# Patient Record
Sex: Male | Born: 1945 | Race: White | Hispanic: No | Marital: Married | Smoking: Former smoker
Health system: Southern US, Community
[De-identification: ages and names within clinical notes are randomized; demographics above are authoritative.]

## PROBLEM LIST (undated history)

## (undated) DIAGNOSIS — E785 Hyperlipidemia, unspecified: Secondary | ICD-10-CM

## (undated) DIAGNOSIS — N189 Chronic kidney disease, unspecified: Secondary | ICD-10-CM

## (undated) DIAGNOSIS — I2510 Atherosclerotic heart disease of native coronary artery without angina pectoris: Secondary | ICD-10-CM

## (undated) DIAGNOSIS — I10 Essential (primary) hypertension: Secondary | ICD-10-CM

## (undated) HISTORY — PX: CORONARY ANGIOPLASTY WITH STENT PLACEMENT: SHX49

## (undated) HISTORY — PX: HERNIA REPAIR: SHX51

---

## 2016-07-23 ENCOUNTER — Inpatient Hospital Stay (HOSPITAL_COMMUNITY)
Admission: EM | Admit: 2016-07-23 | Discharge: 2016-07-27 | DRG: 247 | Disposition: A | Discharge status: Home / Self Care | Payer: Medicare PPO | Attending: Cardiology | Admitting: Cardiology

## 2016-07-23 ENCOUNTER — Emergency Department (HOSPITAL_COMMUNITY): Payer: Medicare PPO

## 2016-07-23 ENCOUNTER — Encounter (HOSPITAL_COMMUNITY): Payer: Self-pay | Admitting: *Deleted

## 2016-07-23 DIAGNOSIS — N183 Chronic kidney disease, stage 3 unspecified: Secondary | ICD-10-CM | POA: Diagnosis present

## 2016-07-23 DIAGNOSIS — N189 Chronic kidney disease, unspecified: Secondary | ICD-10-CM | POA: Diagnosis present

## 2016-07-23 DIAGNOSIS — I25700 Atherosclerosis of coronary artery bypass graft(s), unspecified, with unstable angina pectoris: Secondary | ICD-10-CM | POA: Diagnosis not present

## 2016-07-23 DIAGNOSIS — E876 Hypokalemia: Secondary | ICD-10-CM | POA: Diagnosis present

## 2016-07-23 DIAGNOSIS — Z87891 Personal history of nicotine dependence: Secondary | ICD-10-CM

## 2016-07-23 DIAGNOSIS — I2510 Atherosclerotic heart disease of native coronary artery without angina pectoris: Secondary | ICD-10-CM | POA: Diagnosis present

## 2016-07-23 DIAGNOSIS — R079 Chest pain, unspecified: Secondary | ICD-10-CM | POA: Diagnosis present

## 2016-07-23 DIAGNOSIS — I200 Unstable angina: Secondary | ICD-10-CM | POA: Diagnosis not present

## 2016-07-23 DIAGNOSIS — I25110 Atherosclerotic heart disease of native coronary artery with unstable angina pectoris: Principal | ICD-10-CM | POA: Diagnosis present

## 2016-07-23 DIAGNOSIS — N179 Acute kidney failure, unspecified: Secondary | ICD-10-CM | POA: Diagnosis present

## 2016-07-23 DIAGNOSIS — I129 Hypertensive chronic kidney disease with stage 1 through stage 4 chronic kidney disease, or unspecified chronic kidney disease: Secondary | ICD-10-CM | POA: Diagnosis present

## 2016-07-23 DIAGNOSIS — Z7982 Long term (current) use of aspirin: Secondary | ICD-10-CM

## 2016-07-23 DIAGNOSIS — E785 Hyperlipidemia, unspecified: Secondary | ICD-10-CM

## 2016-07-23 DIAGNOSIS — Z7902 Long term (current) use of antithrombotics/antiplatelets: Secondary | ICD-10-CM

## 2016-07-23 DIAGNOSIS — Z79899 Other long term (current) drug therapy: Secondary | ICD-10-CM | POA: Diagnosis not present

## 2016-07-23 DIAGNOSIS — Z955 Presence of coronary angioplasty implant and graft: Secondary | ICD-10-CM | POA: Diagnosis not present

## 2016-07-23 DIAGNOSIS — I10 Essential (primary) hypertension: Secondary | ICD-10-CM | POA: Diagnosis not present

## 2016-07-23 DIAGNOSIS — R0602 Shortness of breath: Secondary | ICD-10-CM | POA: Diagnosis present

## 2016-07-23 HISTORY — DX: Essential (primary) hypertension: I10

## 2016-07-23 HISTORY — DX: Atherosclerotic heart disease of native coronary artery without angina pectoris: I25.10

## 2016-07-23 HISTORY — DX: Hyperlipidemia, unspecified: E78.5

## 2016-07-23 HISTORY — DX: Chronic kidney disease, unspecified: N18.9

## 2016-07-23 LAB — TROPONIN I
TROPONIN I: 0.04 ng/mL — AB (ref <0.03)
Troponin I: 0.04 ng/mL (ref <0.03)

## 2016-07-23 LAB — I-STAT TROPONIN, ED: TROPONIN I, POC: 0.05 ng/mL (ref 0.00–0.08)

## 2016-07-23 LAB — BASIC METABOLIC PANEL
Anion gap: 7 (ref 5–15)
BUN: 20 mg/dL (ref 6–20)
CO2: 25 mmol/L (ref 22–32)
Calcium: 9.2 mg/dL (ref 8.9–10.3)
Chloride: 106 mmol/L (ref 101–111)
Creatinine, Ser: 1.65 mg/dL — ABNORMAL HIGH (ref 0.61–1.24)
GFR, EST AFRICAN AMERICAN: 47 mL/min — AB (ref >60)
GFR, EST NON AFRICAN AMERICAN: 41 mL/min — AB (ref >60)
Glucose, Bld: 102 mg/dL — ABNORMAL HIGH (ref 65–99)
POTASSIUM: 3.7 mmol/L (ref 3.5–5.1)
SODIUM: 138 mmol/L (ref 135–145)

## 2016-07-23 LAB — CBC
HEMATOCRIT: 44.7 % (ref 39.0–52.0)
Hemoglobin: 14.9 g/dL (ref 13.0–17.0)
MCH: 31.1 pg (ref 26.0–34.0)
MCHC: 33.3 g/dL (ref 30.0–36.0)
MCV: 93.3 fL (ref 78.0–100.0)
PLATELETS: 218 10*3/uL (ref 150–400)
RBC: 4.79 MIL/uL (ref 4.22–5.81)
RDW: 12.8 % (ref 11.5–15.5)
WBC: 7.4 10*3/uL (ref 4.0–10.5)

## 2016-07-23 LAB — HEPATIC FUNCTION PANEL
ALBUMIN: 3.8 g/dL (ref 3.5–5.0)
ALK PHOS: 39 U/L (ref 38–126)
ALT: 25 U/L (ref 17–63)
AST: 26 U/L (ref 15–41)
Bilirubin, Direct: 0.1 mg/dL (ref 0.1–0.5)
Indirect Bilirubin: 0.7 mg/dL (ref 0.3–0.9)
TOTAL PROTEIN: 6.5 g/dL (ref 6.5–8.1)
Total Bilirubin: 0.8 mg/dL (ref 0.3–1.2)

## 2016-07-23 LAB — TSH: TSH: 3.911 u[IU]/mL (ref 0.350–4.500)

## 2016-07-23 LAB — PROTIME-INR
INR: 1.06
Prothrombin Time: 13.8 seconds (ref 11.4–15.2)

## 2016-07-23 LAB — BRAIN NATRIURETIC PEPTIDE: B NATRIURETIC PEPTIDE 5: 22.1 pg/mL (ref 0.0–100.0)

## 2016-07-23 MED ORDER — ASPIRIN 81 MG PO CHEW
324.0000 mg | CHEWABLE_TABLET | Freq: Once | ORAL | Status: AC
  Administered 2016-07-23: 324 mg via ORAL
  Filled 2016-07-23: qty 4

## 2016-07-23 MED ORDER — HEPARIN BOLUS VIA INFUSION
4000.0000 [IU] | Freq: Once | INTRAVENOUS | Status: AC
  Administered 2016-07-23: 4000 [IU] via INTRAVENOUS
  Filled 2016-07-23: qty 4000

## 2016-07-23 MED ORDER — OMEGA-3-ACID ETHYL ESTERS 1 G PO CAPS
1.0000 g | ORAL_CAPSULE | Freq: Every day | ORAL | Status: DC
  Administered 2016-07-24 – 2016-07-27 (×3): 1 g via ORAL
  Filled 2016-07-23 (×3): qty 1

## 2016-07-23 MED ORDER — SODIUM CHLORIDE 0.9 % IV SOLN: 250.0000 mL | INTRAVENOUS | Status: DC | PRN

## 2016-07-23 MED ORDER — AMLODIPINE BESYLATE 5 MG PO TABS
7.5000 mg | ORAL_TABLET | Freq: Every day | ORAL | Status: DC
  Administered 2016-07-24 – 2016-07-25 (×2): 7.5 mg via ORAL
  Administered 2016-07-26: 2.5 mg via ORAL
  Administered 2016-07-27: 7.5 mg via ORAL
  Filled 2016-07-23: qty 2
  Filled 2016-07-23 (×3): qty 1

## 2016-07-23 MED ORDER — SODIUM CHLORIDE 0.9% FLUSH: 3.0000 mL | Freq: Two times a day (BID) | INTRAVENOUS | Status: DC

## 2016-07-23 MED ORDER — NITROGLYCERIN IN D5W 200-5 MCG/ML-% IV SOLN
2.0000 ug/min | INTRAVENOUS | Status: DC
  Administered 2016-07-23: 5 ug/min via INTRAVENOUS
  Filled 2016-07-23: qty 250

## 2016-07-23 MED ORDER — ONDANSETRON HCL 4 MG/2ML IJ SOLN: 4.0000 mg | Freq: Four times a day (QID) | INTRAMUSCULAR | Status: DC | PRN

## 2016-07-23 MED ORDER — HEPARIN (PORCINE) IN NACL 100-0.45 UNIT/ML-% IJ SOLN
900.0000 [IU]/h | INTRAMUSCULAR | Status: DC
  Administered 2016-07-23 – 2016-07-24 (×2): 900 [IU]/h via INTRAVENOUS
  Filled 2016-07-23 (×2): qty 250

## 2016-07-23 MED ORDER — EZETIMIBE 10 MG PO TABS
5.0000 mg | ORAL_TABLET | Freq: Every day | ORAL | Status: DC
  Administered 2016-07-24 – 2016-07-27 (×4): 5 mg via ORAL
  Filled 2016-07-23 (×4): qty 1

## 2016-07-23 MED ORDER — METOPROLOL TARTRATE 12.5 MG HALF TABLET
12.5000 mg | ORAL_TABLET | Freq: Two times a day (BID) | ORAL | Status: DC
  Administered 2016-07-23 – 2016-07-27 (×6): 12.5 mg via ORAL
  Filled 2016-07-23 (×6): qty 1

## 2016-07-23 MED ORDER — AMLODIPINE BESYLATE 5 MG PO TABS
5.0000 mg | ORAL_TABLET | Freq: Once | ORAL | Status: AC
  Administered 2016-07-23: 5 mg via ORAL
  Filled 2016-07-23: qty 1

## 2016-07-23 MED ORDER — ACETAMINOPHEN 325 MG PO TABS: 650.0000 mg | ORAL_TABLET | ORAL | Status: DC | PRN

## 2016-07-23 MED ORDER — NITROGLYCERIN 2 % TD OINT: 0.5000 [in_us] | TOPICAL_OINTMENT | Freq: Once | TRANSDERMAL | Status: DC

## 2016-07-23 MED ORDER — ASPIRIN 81 MG PO CHEW
81.0000 mg | CHEWABLE_TABLET | Freq: Every day | ORAL | Status: DC
  Administered 2016-07-24 – 2016-07-26 (×3): 81 mg via ORAL
  Filled 2016-07-23 (×3): qty 1

## 2016-07-23 MED ORDER — PANTOPRAZOLE SODIUM 40 MG PO TBEC
40.0000 mg | DELAYED_RELEASE_TABLET | ORAL | Status: DC
  Administered 2016-07-26: 40 mg via ORAL
  Filled 2016-07-23: qty 1

## 2016-07-23 MED ORDER — NITROGLYCERIN 0.4 MG SL SUBL: 0.4000 mg | SUBLINGUAL_TABLET | SUBLINGUAL | Status: DC | PRN

## 2016-07-23 MED ORDER — CLOPIDOGREL BISULFATE 75 MG PO TABS
75.0000 mg | ORAL_TABLET | Freq: Every day | ORAL | Status: DC
  Administered 2016-07-24 – 2016-07-26 (×3): 75 mg via ORAL
  Filled 2016-07-23 (×3): qty 1

## 2016-07-23 MED ORDER — SODIUM CHLORIDE 0.9% FLUSH: 3.0000 mL | INTRAVENOUS | Status: DC | PRN

## 2016-07-23 NOTE — H&P (Signed)
Cardiology History & Physical    Patient ID: ALYSSA MANCERA MRN: [id number], DOB: 04/26/1946 Date of Encounter: 07/23/2016, 7:36 PM Primary Physician: No PCP Per Patient Primary Cardiologist: Dr. Buford Dresser in [place], [place]  Chief Complaint: jaw pain, SOB Reason for Admission: above Requesting MD: Dr. Cristi Loron  HPI: Mr. Ozburn is a 70 y/o M with history of CAD s/p 2 ION stents to prox RCA in 11/2012, HTN, HLD, former tobacco abuse who presented to [facility] with SOB/jaw discomfort. He is from [place] but has been living in [place] for several months. Stent card indicates moderate nonobstructive LAD and septal perforator disease. Reports repeat cath 2015 without PCI at that time. Nuc 12/2015 was unremarkable per his report. No hx of CHF known to patient.  For the past 3 weeks he just hasn't felt well. Has noticed intermittent chest discomfort that would randomly creep up into his throat. While playing sports outside this past week he would notice exertional dyspnea as well as a sense of jaw tightness, somewhat reminiscent of prior angina. Today he developed sx at rest. Over the past week he's now realized he gets significantly SOB even with minimal activity. Due to persistent sx, presented to ER. Not tachycardic, tachypneic or hypoxic. He is CP free right now. VSS except BP moderately elevated (166/100). Reports compliance with meds, very nice family. CXR with mild left base subsegmental atelectasis and or infiltrate. No cough, fever noted. Rec'd 324mg  ASA by ED and started on heparin per pharmacy for troponin 0.04 and concern for [place]. Labs otherwise notable for Cr 1.65 of uncertain chronicity.  Past Medical History:  Diagnosis Date  . Coronary artery disease    a. s/p DESx2 to RCA in 11/2012 [facility] hospital in [place].  Marland Kitchen Hyperlipidemia   . Hypertension      Surgical History:  Past Surgical History:  Procedure Laterality Date  . HERNIA REPAIR     Multiple hernia repairs     Home  Meds: Prior to Admission medications   Medication Sig Start Date End Date Taking? Authorizing Provider  amLODipine (NORVASC) 2.5 MG tablet Take 2.5 mg by mouth 2 (two) times daily.   Yes Historical Provider, MD  aspirin 81 MG chewable tablet Chew 81 mg by mouth daily.   Yes Historical Provider, MD  clopidogrel (PLAVIX) 75 MG tablet Take 75 mg by mouth daily.   Yes Historical Provider, MD  co-enzyme Q-10 30 MG capsule Take 30 mg by mouth daily.   Yes Historical Provider, MD  ezetimibe (ZETIA) 10 MG tablet Take 5 mg by mouth daily.   Yes Historical Provider, MD  Flaxseed, Linseed, (FLAXSEED OIL PO) Take 1 tablet by mouth daily.   Yes Historical Provider, MD  metoprolol tartrate (LOPRESSOR) 25 MG tablet Take 12.5 mg by mouth 2 (two) times daily.   Yes Historical Provider, MD  omega-3 acid ethyl esters (LOVAZA) 1 g capsule Take 1 g by mouth daily.   Yes Historical Provider, MD  pantoprazole (PROTONIX) 40 MG tablet Take 40 mg by mouth once a week. Take one tablet by mouth three times a week on Mon wed and fridays   Yes Historical Provider, MD    Allergies: No Known Allergies  Social History   Social History  . Marital status: Married    Spouse name: N/A  . Number of children: N/A  . Years of education: N/A   Occupational History  . Not on file.   Social History Main Topics  . Smoking  status: Former Games developer  . Smokeless tobacco: Never Used     Comment: Quit 1980s  . Alcohol use Yes  . Drug use: No  . Sexual activity: Not on file   Other Topics Concern  . Not on file   Social History Narrative  . No narrative on file     Family History  Problem Relation Age of Onset  . Other Mother     Died of "old age"  . Other Father     Died of "old age"  . CAD Neg Hx     Review of Systems: no hx of bleeding issues. All other systems reviewed and are otherwise negative except as noted above.  Labs:   Lab Results  Component Value Date   WBC 7.4 07/23/2016   HGB 14.9 07/23/2016    HCT 44.7 07/23/2016   MCV 93.3 07/23/2016   PLT 218 07/23/2016    Recent Labs Lab 07/23/16 1546  NA 138  K 3.7  CL 106  CO2 25  BUN 20  CREATININE 1.65*  CALCIUM 9.2  GLUCOSE 102*    Recent Labs  07/23/16 1546  TROPONINI 0.04*    Radiology/Studies:  Dg Chest 2 View  Result Date: 07/23/2016 CLINICAL DATA:  Pain. EXAM: CHEST  2 VIEW COMPARISON:  No recent prior . FINDINGS: Mediastinum hilar structures normal. Mild left base subsegmental atelectasis and or infiltrate. No pleural effusion or pneumothorax. Heart size normal. No acute bony abnormality . IMPRESSION: Mild left base subsegmental atelectasis and or infiltrate. Electronically Signed   By: Maisie Fus  Register   On: 07/23/2016 16:14   Wt Readings from Last 3 Encounters:  07/23/16 166 lb 9 oz (75.6 kg)    EKG: NSR 60bpm nonspecific ST changes no prior to compare to  Physical Exam: Blood pressure 144/89, pulse (!) 53, temperature 98 F (36.7 C), resp. rate 14, height 5\' 8"  (1.727 m), weight 166 lb 9 oz (75.6 kg), SpO2 97 %. Body mass index is 25.33 kg/m. General: Well developed, well nourished WM, in no acute distress. Head: Normocephalic, atraumatic, sclera non-icteric, no xanthomas, nares are without discharge.  Neck: Negative for carotid bruits. JVD not elevated. Lungs: Clear bilaterally to auscultation without wheezes, rales, or rhonchi. Breathing is unlabored. Heart: RRR with S1 S2. No murmurs, rubs, or gallops appreciated. Abdomen: Soft, non-tender, non-distended with normoactive bowel sounds. No hepatomegaly. No rebound/guarding. No obvious abdominal masses. Msk:  Strength and tone appear normal for age. Extremities: No clubbing or cyanosis. No edema.  Distal pedal pulses are 2+ and equal bilaterally. Neuro: Alert and oriented X 3. No focal deficit. No facial asymmetry. Moves all extremities spontaneously. Psych:  Responds to questions appropriately with a normal affect.    Assessment and Plan  70 y/o M with  history of CAD s/p 2 ION stents to prox RCA in 11/2012 with residual nonobstructive disease, HTN, HLD, former tobacco abuse who presented to [facility] with SOB/jaw discomfort. Troponin 0.04, Cr 1.65.  1. Jaw pain/dyspnea concerning for [place] - admit, cycle troponins, continue ASA, IV heparin, BB. He reports intolerance of multiple prior statins. Check lipids in AM. Anticipate cath on Monday if he remains stable, sooner if recurrence of symptoms refractory to medical therapy or worsening EKG changes.   2. CAD - as above.  3. HTN - BP elevated in ER. He states "it's never this high." On unusual amlodipine regimen of 2.5mg  BID - per d/w MD, given amlodipine 5mg  now and increase AM dose to 7.5mg  BID. Continue low  dose metoprolol - HR precludes increase. Change NTG paste to IV NTG which can be titrated for BP.  4. Renal insufficiency - unclear duration. Repeat BMET in AM to recheck/trend. Will need to take this into consideration when planning cath. Check echo to assess LVEF to avoid need for LV gram during cath.  5. Hyperlipidemia - check lipids in AM. May benefit from pharmD referral to discuss [facility] drugs since he has intolerance to multiple statins (if he remains local).  Pt was seen/examined by myself and Dr. Mayford Knifeurner in ER, plan formulated above per our discussion.  Signed, Laurann Montanaayna N Dunn PA-C 07/23/2016, 7:36 PM Pager: [phone]

## 2016-07-23 NOTE — Progress Notes (Signed)
ANTICOAGULATION CONSULT NOTE - Initial Consult  Pharmacy Consult for Heparin Indication: chest pain/ACS  No Known Allergies  Patient Measurements: Height: 5\' 8"  (172.7 cm) Weight: 166 lb 9 oz (75.6 kg) IBW/kg (Calculated) : 68.4 Heparin Dosing Weight: 75.6 kg  Vital Signs: Temp: 98 F (36.7 C) (09/08 1545) BP: 149/92 (09/08 1657) Pulse Rate: 51 (09/08 1657)  Labs:  Recent Labs  07/23/16 1546  HGB 14.9  HCT 44.7  PLT 218  CREATININE 1.65*  TROPONINI 0.04*    Estimated Creatinine Clearance: 40.9 mL/min (by C-G formula based on SCr of 1.65 mg/dL).   Medical History: Past Medical History:  Diagnosis Date  . Coronary artery disease   . Hypertension     Medications:   (Not in a hospital admission) Scheduled:  . aspirin  324 mg Oral Once  . nitroGLYCERIN  0.5 inch Topical Once   Infusions:    Assessment: 70yo with history of HTN and CAD presents with SOB. Pharmacy is consulted to dose heparin for ACS/chest pain.   Goal of Therapy:  Heparin level 0.3-0.7 units/ml Monitor platelets by anticoagulation protocol: Yes   Plan:  Give 4000 units bolus x 1 Start heparin infusion at 900 units/hr Check anti-Xa level in 6 hours and daily while on heparin Continue to monitor H&H and platelets  Arlean Hoppingorey M. Newman PiesBall, PharmD, BCPS Clinical Pharmacist Pager [phone] 07/23/2016,5:48 PM

## 2016-07-23 NOTE — ED Notes (Signed)
Dr. Corlis LeakMackuen notified of troponin. Pt moved back next.

## 2016-07-23 NOTE — ED Provider Notes (Signed)
[facility]-EMERGENCY DEPT Provider Note   CSN: [id number] Arrival date & time: 07/23/16  1539   History   Chief Complaint Chief Complaint  Patient presents with  . Shortness of Breath    HPI Todd Mills is a 70 y.o. male.  The history is provided by the patient and the spouse.   70 year old male with history of CAD status post 2 stents, hypertension, hyperlipidemia presenting with shortness of breath and jaw discomfort. Onset was about 3 weeks ago. Waxing and waning since then. Context- typically symptoms occur at rest. Today, patient had an episode that started at 3 PM that was more severe than prior episodes. Started at rest today. Described as feeling short of breath, with facial flushing, and jaw pressure. Similar prior symptoms when he was having a stress test and was found to have CAD leading to his cath in the past. Denies overt chest pain. Symptoms are typically alleviated by nitroglycerin. Today, he took a nitroglycerin and symptoms did not completely resolve as they had before. Associated with feeling fatigued. Mild severity now. Pt also endorses increased SOB with exertion recently and decreased exercise tolerance.    Past Medical History:  Diagnosis Date  . Coronary artery disease    a. s/p 2 ION stents to RCA in 11/2012 [facility] hospital in [facility].  Marland Kitchen Hyperlipidemia   . Hypertension     Patient Active Problem List   Diagnosis Date Noted  . Unstable angina (HCC) 07/23/2016    Past Surgical History:  Procedure Laterality Date  . HERNIA REPAIR     Multiple hernia repairs       Home Medications    Prior to Admission medications   Medication Sig Start Date End Date Taking? Authorizing Provider  amLODipine (NORVASC) 2.5 MG tablet Take 2.5 mg by mouth 2 (two) times daily.   Yes Historical Provider, MD  aspirin 81 MG chewable tablet Chew 81 mg by mouth daily.   Yes Historical Provider, MD  clopidogrel (PLAVIX) 75 MG tablet Take 75 mg by mouth daily.   Yes Historical  Provider, MD  co-enzyme Q-10 30 MG capsule Take 30 mg by mouth daily.   Yes Historical Provider, MD  ezetimibe (ZETIA) 10 MG tablet Take 5 mg by mouth daily.   Yes Historical Provider, MD  Flaxseed, Linseed, (FLAXSEED OIL PO) Take 1 tablet by mouth daily.   Yes Historical Provider, MD  metoprolol tartrate (LOPRESSOR) 25 MG tablet Take 12.5 mg by mouth 2 (two) times daily.   Yes Historical Provider, MD  omega-3 acid ethyl esters (LOVAZA) 1 g capsule Take 1 g by mouth daily.   Yes Historical Provider, MD  pantoprazole (PROTONIX) 40 MG tablet Take 40 mg by mouth once a week. Take one tablet by mouth three times a week on Mon wed and fridays   Yes Historical Provider, MD    Family History Family History  Problem Relation Age of Onset  . Other Mother     Died of "old age"  . Other Father     Died of "old age"  . CAD Neg Hx     Social History Social History  Substance Use Topics  . Smoking status: Former Games developer  . Smokeless tobacco: Never Used     Comment: Quit 1980s  . Alcohol use Yes     Allergies   Review of patient's allergies indicates no known allergies.   Review of Systems Review of Systems  Constitutional: Negative for fever.  Respiratory: Positive for shortness  of breath. Negative for cough.   Cardiovascular: Negative for chest pain and palpitations.  Gastrointestinal: Positive for nausea. Negative for abdominal pain and vomiting.  Genitourinary: Negative for decreased urine volume.  Skin: Negative for rash.  Neurological: Negative for syncope and light-headedness.  Hematological: Bruises/bleeds easily (on ASA, plavix).  All other systems reviewed and are negative.    Physical Exam Updated Vital Signs BP 129/79   Pulse 60   Temp 98 F (36.7 C)   Resp 16   Ht 5\' 8"  (1.727 m)   Wt 75.6 kg   SpO2 95%   BMI 25.33 kg/m   Physical Exam  Constitutional: He appears well-developed and well-nourished.  HENT:  Head: Normocephalic and atraumatic.  Eyes:  Conjunctivae are normal.  Neck: Neck supple.  Cardiovascular: Normal rate and regular rhythm.   No murmur heard. Pulmonary/Chest: Effort normal and breath sounds normal. No respiratory distress. He exhibits no tenderness.  Abdominal: Soft. There is no tenderness.  Musculoskeletal: He exhibits no edema.  Neurological: He is alert.  Skin: Skin is warm and dry.  Psychiatric: He has a normal mood and affect.  Nursing note and vitals reviewed.    ED Treatments / Results  Labs (all labs ordered are listed, but only abnormal results are displayed) Labs Reviewed  BASIC METABOLIC PANEL - Abnormal; Notable for the following:       Result Value   Glucose, Bld 102 (*)    Creatinine, Ser 1.65 (*)    GFR calc non Af Amer 41 (*)    GFR calc Af Amer 47 (*)    All other components within normal limits  TROPONIN I - Abnormal; Notable for the following:    Troponin I 0.04 (*)    All other components within normal limits  MRSA PCR SCREENING  CBC  PROTIME-INR  TSH  HEPATIC FUNCTION PANEL  TROPONIN I  BRAIN NATRIURETIC PEPTIDE  BASIC METABOLIC PANEL  LIPID PANEL  CBC  HEPARIN LEVEL (UNFRACTIONATED)  TROPONIN I  TROPONIN I  HEMOGLOBIN A1C  I-STAT TROPOININ, ED    EKG  EKG Interpretation  Date/Time:  Friday July 23 2016 15:43:00 EDT Ventricular Rate:  60 PR Interval:  178 QRS Duration: 86 QT Interval:  428 QTC Calculation: 428 R Axis:   74 Text Interpretation:  Normal sinus rhythm Nonspecific ST abnormality Abnormal ECG No previous tracing Confirmed by KNOTT MD, DANIEL [phone]) on 07/23/2016 5:46:24 PM       Radiology Dg Chest 2 View  Result Date: 07/23/2016 CLINICAL DATA:  Pain. EXAM: CHEST  2 VIEW COMPARISON:  No recent prior . FINDINGS: Mediastinum hilar structures normal. Mild left base subsegmental atelectasis and or infiltrate. No pleural effusion or pneumothorax. Heart size normal. No acute bony abnormality . IMPRESSION: Mild left base subsegmental atelectasis and or  infiltrate. Electronically Signed   By: Maisie Fus  Register   On: 07/23/2016 16:14    Procedures Procedures (including critical care time)  Medications Ordered in ED Medications  heparin ADULT infusion 100 units/mL (25000 units/233mL sodium chloride 0.45%) (900 Units/hr Intravenous Transfusing/Transfer 07/23/16 2254)  nitroGLYCERIN 50 mg in dextrose 5 % 250 mL (0.2 mg/mL) infusion (2 mcg/min Intravenous Transfusing/Transfer 07/23/16 2254)  amLODipine (NORVASC) tablet 7.5 mg (7.5 mg Oral Not Given 07/23/16 2231)  aspirin chewable tablet 81 mg (not administered)  clopidogrel (PLAVIX) tablet 75 mg (not administered)  ezetimibe (ZETIA) tablet 5 mg (not administered)  metoprolol tartrate (LOPRESSOR) tablet 12.5 mg (12.5 mg Oral Given 07/23/16 2231)  omega-3 acid ethyl esters (  LOVAZA) capsule 1 g (not administered)  pantoprazole (PROTONIX) EC tablet 40 mg (not administered)  nitroGLYCERIN (NITROSTAT) SL tablet 0.4 mg (not administered)  acetaminophen (TYLENOL) tablet 650 mg (not administered)  ondansetron (ZOFRAN) injection 4 mg (not administered)  sodium chloride flush (NS) 0.9 % injection 3 mL (not administered)  sodium chloride flush (NS) 0.9 % injection 3 mL (not administered)  0.9 %  sodium chloride infusion (not administered)  aspirin chewable tablet 324 mg (324 mg Oral Given 07/23/16 1839)  heparin bolus via infusion 4,000 Units (4,000 Units Intravenous Bolus from Bag 07/23/16 1843)  amLODipine (NORVASC) tablet 5 mg (5 mg Oral Given 07/23/16 2017)     Initial Impression / Assessment and Plan / ED Course  I have reviewed the triage vital signs and the nursing notes.  Pertinent labs & imaging results that were available during my care of the patient were reviewed by me and considered in my medical decision making (see chart for details).  Clinical Course    70 year old male with history of CAD status post 2 stents, hypertension, hyperlipidemia presenting with shortness of breath and jaw  discomfort, similar to prior reported anginal symptoms in the past. Were not alleviated by nitroglycerin as per usual. Occurred at rest. Patient is afebrile with stable vital signs other than bradycardia in 50s. He is on metoprolol.  EKG with nonspecific ST abnormalities, but no overt ST elevations or T-wave inversions. Troponin positive. Presentation concerning for unstable angina, possible NSTEMI. ASA given, heparin started. Admitted to Cardiology for further care, plan for likely cath upcoming.   Case discussed with Dr. Clydene PughKnott, who oversaw management of this patient.   Final Clinical Impressions(s) / ED Diagnoses   Final diagnoses:  Unstable angina [facility](HCC)    New Prescriptions Current Discharge Medication List       Urban GibsonJenny Jupiter Kabir, MD 07/23/16 [id number]    Lyndal Pulleyaniel Knott, MD 07/24/16 [phone]

## 2016-07-23 NOTE — ED Notes (Signed)
Troponin Critical 0.04 per main lab.

## 2016-07-23 NOTE — ED Triage Notes (Signed)
The pt has had some sob for approx 2 weeks  For the past 2w days he has had more sob with some jaw tenderness.  Worse today  He has been here for 2 months.

## 2016-07-24 ENCOUNTER — Inpatient Hospital Stay (HOSPITAL_COMMUNITY): Payer: Medicare PPO

## 2016-07-24 DIAGNOSIS — I2510 Atherosclerotic heart disease of native coronary artery without angina pectoris: Secondary | ICD-10-CM

## 2016-07-24 DIAGNOSIS — I200 Unstable angina: Secondary | ICD-10-CM

## 2016-07-24 LAB — CBC
HCT: 41.3 % (ref 39.0–52.0)
Hemoglobin: 13.7 g/dL (ref 13.0–17.0)
MCH: 30.6 pg (ref 26.0–34.0)
MCHC: 33.2 g/dL (ref 30.0–36.0)
MCV: 92.4 fL (ref 78.0–100.0)
Platelets: 199 10*3/uL (ref 150–400)
RBC: 4.47 MIL/uL (ref 4.22–5.81)
RDW: 13.3 % (ref 11.5–15.5)
WBC: 8.3 10*3/uL (ref 4.0–10.5)

## 2016-07-24 LAB — BASIC METABOLIC PANEL
Anion gap: 9 (ref 5–15)
BUN: 19 mg/dL (ref 6–20)
CHLORIDE: 105 mmol/L (ref 101–111)
CO2: 24 mmol/L (ref 22–32)
Calcium: 9 mg/dL (ref 8.9–10.3)
Creatinine, Ser: 1.49 mg/dL — ABNORMAL HIGH (ref 0.61–1.24)
GFR calc non Af Amer: 46 mL/min — ABNORMAL LOW (ref >60)
GFR, EST AFRICAN AMERICAN: 53 mL/min — AB (ref >60)
Glucose, Bld: 136 mg/dL — ABNORMAL HIGH (ref 65–99)
Potassium: 3.3 mmol/L — ABNORMAL LOW (ref 3.5–5.1)
Sodium: 138 mmol/L (ref 135–145)

## 2016-07-24 LAB — TROPONIN I
TROPONIN I: 0.03 ng/mL — AB (ref <0.03)
Troponin I: 0.03 ng/mL (ref <0.03)
Troponin I: 0.03 ng/mL (ref <0.03)

## 2016-07-24 LAB — LIPID PANEL
CHOL/HDL RATIO: 5.2 ratio
Cholesterol: 213 mg/dL — ABNORMAL HIGH (ref 0–200)
HDL: 41 mg/dL (ref >40)
LDL CALC: 130 mg/dL — AB (ref 0–99)
TRIGLYCERIDES: 212 mg/dL — AB (ref <150)
VLDL: 42 mg/dL — AB (ref 0–40)

## 2016-07-24 LAB — MRSA PCR SCREENING: MRSA by PCR: NEGATIVE

## 2016-07-24 LAB — HEPARIN LEVEL (UNFRACTIONATED)
HEPARIN UNFRACTIONATED: 0.5 [IU]/mL (ref 0.30–0.70)
HEPARIN UNFRACTIONATED: 0.53 [IU]/mL (ref 0.30–0.70)

## 2016-07-24 LAB — ECHOCARDIOGRAM COMPLETE
Height: 68 in
WEIGHTICAEL: 2638.47 [oz_av]

## 2016-07-24 MED ORDER — ROSUVASTATIN CALCIUM 20 MG PO TABS
20.0000 mg | ORAL_TABLET | Freq: Every day | ORAL | Status: DC
  Administered 2016-07-24 – 2016-07-25 (×2): 20 mg via ORAL
  Filled 2016-07-24 (×3): qty 1

## 2016-07-24 MED ORDER — POTASSIUM CHLORIDE CRYS ER 20 MEQ PO TBCR
40.0000 meq | EXTENDED_RELEASE_TABLET | Freq: Once | ORAL | Status: AC
  Administered 2016-07-24: 40 meq via ORAL
  Filled 2016-07-24: qty 2

## 2016-07-24 NOTE — Progress Notes (Signed)
Patient Name: Todd Mills Date of Encounter: 07/24/2016  Hospital Problem List     Active Problems:   Unstable angina [facility])    Patient Profile     Todd Mills is a 70 y/o M with history of CAD s/p 2 ION stents to prox RCA in 11/2012, HTN, HLD, former tobacco abuse who presented to [facility] with SOB/jaw discomfort. He is from [place] but has been living in [place] for several months. Stent card indicates moderate nonobstructive LAD and septal perforator disease. Reports repeat cath 2015 without PCI at that time. Nuc 12/2015 was unremarkable per his report. No hx of CHF known to patient.Troponin 0.04, Cr 1.65.  Subjective   No chest pain.  No SOB.   Inpatient Medications    . amLODipine  7.5 mg Oral Daily  . aspirin  81 mg Oral Daily  . clopidogrel  75 mg Oral Daily  . ezetimibe  5 mg Oral Daily  . metoprolol tartrate  12.5 mg Oral BID  . omega-3 acid ethyl esters  1 g Oral Daily  . [START ON 07/26/2016] pantoprazole  40 mg Oral Q M,W,F  . sodium chloride flush  3 mL Intravenous Q12H    Vital Signs    Vitals:   07/24/16 0100 07/24/16 0400 07/24/16 0500 07/24/16 0800  BP: 111/70 124/82  138/81  Pulse: (!) 51 (!) 48  (!) 51  Resp: 12 12  12   Temp:  98.1 F (36.7 C)  98 F (36.7 C)  TempSrc:  Oral  Oral  SpO2: 96% 96%  97%  Weight:   164 lb 14.5 oz (74.8 kg)   Height:        Intake/Output Summary (Last 24 hours) at 07/24/16 0949 Last data filed at 07/24/16 0800  Gross per 24 hour  Intake           446.68 ml  Output             1250 ml  Net          -803.32 ml   Filed Weights   07/23/16 2315 07/23/16 2358 07/24/16 0500  Weight: 165 lb 2 oz (74.9 kg) 165 lb 2 oz (74.9 kg) 164 lb 14.5 oz (74.8 kg)    Physical Exam    GEN: Well nourished, well developed, in  no acute distress.  Neck: Supple, no JVD, carotid bruits, or masses. Cardiac: RRR, no rubs, or gallops. No clubbing, cyanosis, no edema.  Radials/DP/PT 2+ and equal bilaterally.  Respiratory:  Respirations   regular and unlabored, clear to auscultation bilaterally. GI: Soft, nontender, nondistended, BS + x 4. Neuro:  Strength and sensation are intact.   Labs    CBC  Recent Labs  07/23/16 1546 07/24/16 0044  WBC 7.4 8.3  HGB 14.9 13.7  HCT 44.7 41.3  MCV 93.3 92.4  PLT 218 199   Basic Metabolic Panel  Recent Labs  07/23/16 1546 07/24/16 0044  NA 138 138  K 3.7 3.3*  CL 106 105  CO2 25 24  GLUCOSE 102* 136*  BUN 20 19  CREATININE 1.65* 1.49*  CALCIUM 9.2 9.0   Liver Function Tests  Recent Labs  07/23/16 2232  AST 26  ALT 25  ALKPHOS 39  BILITOT 0.8  PROT 6.5  ALBUMIN 3.8   No results for input(s): LIPASE, AMYLASE in the last 72 hours. Cardiac Enzymes  Recent Labs  07/23/16 2232 07/24/16 0044 07/24/16 0710  TROPONINI 0.04* 0.03* 0.03*   BNP Invalid input(s): POCBNP  D-Dimer No results for input(s): DDIMER in the last 72 hours. Hemoglobin A1C No results for input(s): HGBA1C in the last 72 hours. Fasting Lipid Panel  Recent Labs  07/24/16 0044  CHOL 213*  HDL 41  LDLCALC 130*  TRIG 212*  CHOLHDL 5.2   Thyroid Function Tests  Recent Labs  07/23/16 2232  TSH 3.911    Telemetry    SB  ECG    NSR, rate 52, no acute ST T wave changed.  07/24/2016  Radiology    Dg Chest 2 View  Result Date: 07/23/2016 CLINICAL DATA:  Pain. EXAM: CHEST  2 VIEW COMPARISON:  No recent prior . FINDINGS: Mediastinum hilar structures normal. Mild left base subsegmental atelectasis and or infiltrate. No pleural effusion or pneumothorax. Heart size normal. No acute bony abnormality . IMPRESSION: Mild left base subsegmental atelectasis and or infiltrate. Electronically Signed   By: Maisie Fushomas  Register   On: 07/23/2016 16:14    Assessment & Plan    Jaw pain/dyspnea:  Nonspecific trop elevation.  However, pain worrisome for unstable angina.  Plan cath on Monday.   CAD:  As above.   CKD:  Creat is slightly better than yesterday.  Will hydrate in anticipation of  cath.  Limit dye.    Hyperlipidemia:  LDL 130.   He did have vague muscle aches on statins in the past but agrees to try Crestor.   Hypokalemia:  Supplement.    Signed, Rollene RotundaJames Kayvion Arneson, MD  07/24/2016, 9:49 AM

## 2016-07-24 NOTE — Progress Notes (Signed)
Talk with Dr. Orson AloeHenderson (cardiologist on-call) and reported Hr of 56. Asked did he want patient to get scheduled Metoprolol of 12.5mg  or hold and if he wanted him to come off Nitro drip?  Ordered to give Metoprolol d/t he takes it at home and to wean off drip if no chest pain. Will follow orders and continue to monitor.

## 2016-07-24 NOTE — Progress Notes (Signed)
  Echocardiogram 2D Echocardiogram has been performed.  Arvil ChacoFoster, Jullia Mulligan 07/24/2016, 5:00 PM

## 2016-07-24 NOTE — Progress Notes (Addendum)
ANTICOAGULATION CONSULT NOTE - Initial Consult  Pharmacy Consult for Heparin Indication: chest pain/ACS  No Known Allergies  Patient Measurements: Height: 5\' 8"  (172.7 cm) Weight: 164 lb 14.5 oz (74.8 kg) IBW/kg (Calculated) : 68.4 Heparin Dosing Weight: 75.6 kg  Vital Signs: Temp: 98.1 F (36.7 C) (09/09 0400) Temp Source: Oral (09/09 0400) BP: 124/82 (09/09 0400) Pulse Rate: 48 (09/09 0400)  Labs:  Recent Labs  07/23/16 1546 07/23/16 2232 07/24/16 0044  HGB 14.9  --  13.7  HCT 44.7  --  41.3  PLT 218  --  199  LABPROT  --  13.8  --   INR  --  1.06  --   HEPARINUNFRC  --   --  0.53  CREATININE 1.65*  --  1.49*  TROPONINI 0.04* 0.04* 0.03*    Estimated Creatinine Clearance: 45.3 mL/min (by C-G formula based on SCr of 1.49 mg/dL).   Medical History: Past Medical History:  Diagnosis Date  . Coronary artery disease    a. s/p 2 ION stents to RCA in 11/2012 [facility] [place]  Marland Kitchen. Hyperlipidemia   . Hypertension     Medications:  Prescriptions Prior to Admission  Medication Sig Dispense Refill Last Dose  . amLODipine (NORVASC) 2.5 MG tablet Take 2.5 mg by mouth 2 (two) times daily.   07/23/2016 at Unknown time  . aspirin 81 MG chewable tablet Chew 81 mg by mouth daily.   07/22/2016 at Unknown time  . clopidogrel (PLAVIX) 75 MG tablet Take 75 mg by mouth daily.   07/23/2016 at Unknown time  . co-enzyme Q-10 30 MG capsule Take 30 mg by mouth daily.   07/23/2016 at Unknown time  . ezetimibe (ZETIA) 10 MG tablet Take 5 mg by mouth daily.   07/23/2016 at Unknown time  . Flaxseed, Linseed, (FLAXSEED OIL PO) Take 1 tablet by mouth daily.   07/23/2016 at Unknown time  . metoprolol tartrate (LOPRESSOR) 25 MG tablet Take 12.5 mg by mouth 2 (two) times daily.   07/23/2016 at 0800  . omega-3 acid ethyl esters (LOVAZA) 1 g capsule Take 1 g by mouth daily.   07/23/2016 at Unknown time  . pantoprazole (PROTONIX) 40 MG tablet Take 40 mg by mouth once a week. Take one tablet by mouth  three times a week on Mon wed and fridays   07/23/2016 at Unknown time   Scheduled:  . amLODipine  7.5 mg Oral Daily  . aspirin  81 mg Oral Daily  . clopidogrel  75 mg Oral Daily  . ezetimibe  5 mg Oral Daily  . metoprolol tartrate  12.5 mg Oral BID  . omega-3 acid ethyl esters  1 g Oral Daily  . [START ON 07/26/2016] pantoprazole  40 mg Oral Q M,W,F  . sodium chloride flush  3 mL Intravenous Q12H   Infusions:  . heparin 900 Units/hr (07/23/16 2300)  . nitroGLYCERIN 5 mcg/min (07/23/16 2300)    Assessment: 70yo with history of HTN and CAD presents with SOB/jaw pain, on IV heparin for ACS, likely cath on Monday.  Heparin level = 0.53, therapeutic on 900 units/hr, CBC stable, No bleeding noted per chart.  Goal of Therapy:  Heparin level 0.3-0.7 units/ml Monitor platelets by anticoagulation protocol: Yes   Plan:  Continue IV heparin infusion at 900 units/hr Recheck heparin level at 0900 Continue to monitor H&H and platelets  Bayard HuggerMei Antony Sian, PharmD, BCPS  Clinical Pharmacist  Pager: [phone]   07/24/2016,8:22 AM  Addendum: Confirmatory heparin level =  0.5, remains therapeutic.   Continue 900 units/hr.   Bayard Hugger, PharmD, BCPS  Clinical Pharmacist  Pager: [phone]

## 2016-07-25 DIAGNOSIS — E785 Hyperlipidemia, unspecified: Secondary | ICD-10-CM

## 2016-07-25 DIAGNOSIS — I10 Essential (primary) hypertension: Secondary | ICD-10-CM

## 2016-07-25 LAB — BASIC METABOLIC PANEL
ANION GAP: 7 (ref 5–15)
BUN: 20 mg/dL (ref 6–20)
CHLORIDE: 107 mmol/L (ref 101–111)
CO2: 24 mmol/L (ref 22–32)
CREATININE: 1.45 mg/dL — AB (ref 0.61–1.24)
Calcium: 9.1 mg/dL (ref 8.9–10.3)
GFR calc non Af Amer: 48 mL/min — ABNORMAL LOW (ref >60)
GFR, EST AFRICAN AMERICAN: 55 mL/min — AB (ref >60)
Glucose, Bld: 108 mg/dL — ABNORMAL HIGH (ref 65–99)
Potassium: 4 mmol/L (ref 3.5–5.1)
SODIUM: 138 mmol/L (ref 135–145)

## 2016-07-25 LAB — HEMOGLOBIN A1C
HEMOGLOBIN A1C: 5.7 % — AB (ref 4.8–5.6)
Mean Plasma Glucose: 117 mg/dL

## 2016-07-25 LAB — HEPARIN LEVEL (UNFRACTIONATED): Heparin Unfractionated: 0.44 IU/mL (ref 0.30–0.70)

## 2016-07-25 MED ORDER — SODIUM CHLORIDE 0.9% FLUSH
3.0000 mL | Freq: Two times a day (BID) | INTRAVENOUS | Status: DC
  Administered 2016-07-25 – 2016-07-26 (×2): 3 mL via INTRAVENOUS

## 2016-07-25 MED ORDER — SODIUM CHLORIDE 0.9% FLUSH: 3.0000 mL | INTRAVENOUS | Status: DC | PRN

## 2016-07-25 MED ORDER — SODIUM CHLORIDE 0.9 % WEIGHT BASED INFUSION: 1.0000 mL/kg/h | INTRAVENOUS | Status: DC

## 2016-07-25 MED ORDER — NITROGLYCERIN IN D5W 200-5 MCG/ML-% IV SOLN: 2.0000 ug/min | INTRAVENOUS | Status: DC

## 2016-07-25 MED ORDER — HEPARIN (PORCINE) IN NACL 100-0.45 UNIT/ML-% IJ SOLN
900.0000 [IU]/h | INTRAMUSCULAR | Status: DC
  Administered 2016-07-25: 900 [IU]/h via INTRAVENOUS
  Filled 2016-07-25: qty 250

## 2016-07-25 MED ORDER — SODIUM CHLORIDE 0.9 % IV SOLN: 250.0000 mL | INTRAVENOUS | Status: DC | PRN

## 2016-07-25 NOTE — Progress Notes (Signed)
Patient Name: Carlene CoriaJohn A Schwering Date of Encounter: 07/25/2016  Hospital Problem List     Active Problems:   Unstable angina [facility](HCC)    Patient Profile     Mr. Cherlynn PerchesKetterman is a 70 y/o M with history of CAD s/p 2 ION stents to prox RCA in 11/2012, HTN, HLD, former tobacco abuse who [facility] with SOB/j[place] discomfort. [place]. Stent card indicates moderate nonobstructive LAD and septal perforator disease. Reports repeat cath 2015 without PCI at that time. Nuc 12/2015 was unremarkable per his report. No hx of CHF known to patient.Troponin 0.04, Cr 1.65.  Subjective   Some chest pain last night but IV NTG had been stopped by nursing for low heart rate.  Restarted this AM and pain free currently.   Inpatient Medications    . amLODipine  7.5 mg Oral Daily  . aspirin  81 mg Oral Daily  . clopidogrel  75 mg Oral Daily  . ezetimibe  5 mg Oral Daily  . metoprolol tartrate  12.5 mg Oral BID  . omega-3 acid ethyl esters  1 g Oral Daily  . [START ON 07/26/2016] pantoprazole  40 mg Oral Q M,W,F  . rosuvastatin  20 mg Oral q1800  . sodium chloride flush  3 mL Intravenous Q12H    Vital Signs    Vitals:   07/25/16 0347 07/25/16 0538 07/25/16 0800 07/25/16 0935  BP: 132/88  135/86 (!) 144/82  Pulse: (!) 49     Resp: 11  15 (!) 25  Temp: 97.7 F (36.5 C)  97.8 F (36.6 C)   TempSrc: Oral  Oral   SpO2: 97%  100%   Weight:  161 lb 13.1 oz (73.4 kg)    Height:        Intake/Output Summary (Last 24 hours) at 07/25/16 1018 Last data filed at 07/25/16 0535  Gross per 24 hour  Intake            867.5 ml  Output             1600 ml  Net           -732.5 ml   Filed Weights   07/23/16 2358 07/24/16 0500 07/25/16 0538  Weight: 165 lb 2 oz (74.9 kg) 164 lb 14.5 oz (74.8 kg) 161 lb 13.1 oz (73.4 kg)    Physical Exam    GEN: Well nourished, well developed, in  no acute distress.  Neck: Supple, no JVD, carotid bruits, or  masses. Cardiac: RRR, no rubs, or gallops. No clubbing, cyanosis, no edema.  Radials/DP/PT 2+ and equal bilaterally.  Respiratory:  Respirations  regular and unlabored, clear to auscultation bilaterally. GI: Soft, nontender, nondistended, BS + x 4. Neuro:  Strength and sensation are intact.   Labs    CBC  Recent Labs  07/23/16 1546 07/24/16 0044  WBC 7.4 8.3  HGB 14.9 13.7  HCT 44.7 41.3  MCV 93.3 92.4  PLT 218 199   Basic Metabolic Panel  Recent Labs  07/24/16 0044 07/25/16 0221  NA 138 138  K 3.3* 4.0  CL 105 107  CO2 24 24  GLUCOSE 136* 108*  BUN 19 20  CREATININE 1.49* 1.45*  CALCIUM 9.0 9.1   Liver Function Tests  Recent Labs  07/23/16 2232  AST 26  ALT 25  ALKPHOS 39  BILITOT 0.8  PROT 6.5  ALBUMIN 3.8   No results for input(s): LIPASE,  AMYLASE in the last 72 hours. Cardiac Enzymes  Recent Labs  07/24/16 0044 07/24/16 0710 07/24/16 1228  TROPONINI 0.03* 0.03* 0.03*   BNP Invalid input(s): POCBNP D-Dimer No results for input(s): DDIMER in the last 72 hours. Hemoglobin A1C No results for input(s): HGBA1C in the last 72 hours. Fasting Lipid Panel  Recent Labs  07/24/16 0044  CHOL 213*  HDL 41  LDLCALC 130*  TRIG 212*  CHOLHDL 5.2   Thyroid Function Tests  Recent Labs  07/23/16 2232  TSH 3.911    Telemetry    SB  ECG    NSR, rate 52, no acute ST T wave changed.  07/25/2016  Radiology    Dg Chest 2 View  Result Date: 07/23/2016 CLINICAL DATA:  Pain. EXAM: CHEST  2 VIEW COMPARISON:  No recent prior . FINDINGS: Mediastinum hilar structures normal. Mild left base subsegmental atelectasis and or infiltrate. No pleural effusion or pneumothorax. Heart size normal. No acute bony abnormality . IMPRESSION: Mild left base subsegmental atelectasis and or infiltrate. Electronically Signed   By: Maisie Fus  Register   On: 07/23/2016 16:14   ECHO: - Left ventricle: The cavity size was normal. Wall thickness was   normal. Systolic  function was normal. The estimated ejection   fraction was in the range of 55% to 60%. Wall motion was normal;   there were no regional wall motion abnormalities. Doppler   parameters are consistent with abnormal left ventricular   relaxation (grade 1 diastolic dysfunction). - Left atrium: The atrium was mildly dilated.   Assessment & Plan    Jaw pain/dyspnea:  Nonspecific trop elevation.  However, pain worrisome for unstable angina.  Plan cath on Monday.   Echo as above.   CAD:  As above.   CKD:  Creat is stabke.  Will hydrate in anticipation of cath.  Limit dye.    Hyperlipidemia:  LDL 130.   He did have vague muscle aches on statins in the past but agrees to try Crestor.  Started yesterday  Hypokalemia:  Supplemented.    Signed, Rollene Rotunda, MD  07/25/2016, 10:18 AM

## 2016-07-25 NOTE — Progress Notes (Signed)
Feeling better, flush feeling subsided, pressure subsiding some, only slight feeling now.

## 2016-07-25 NOTE — Progress Notes (Signed)
ANTICOAGULATION CONSULT NOTE - Initial Consult  Pharmacy Consult for Heparin Indication: chest pain/ACS  No Known Allergies  Patient Measurements: Height: 5\' 8"  (172.7 cm) Weight: 161 lb 13.1 oz (73.4 kg) IBW/kg (Calculated) : 68.4 Heparin Dosing Weight: 75.6 kg  Vital Signs: Temp: 97.8 F (36.6 C) (09/10 0800) Temp Source: Oral (09/10 0800) BP: 144/82 (09/10 0935) Pulse Rate: 49 (09/10 0347)  Labs:  Recent Labs  07/23/16 1546 07/23/16 2232 07/24/16 0044 07/24/16 0710 07/24/16 0914 07/24/16 1228 07/25/16 0221  HGB 14.9  --  13.7  --   --   --   --   HCT 44.7  --  41.3  --   --   --   --   PLT 218  --  199  --   --   --   --   LABPROT  --  13.8  --   --   --   --   --   INR  --  1.06  --   --   --   --   --   HEPARINUNFRC  --   --  0.53  --  0.50  --  0.44  CREATININE 1.65*  --  1.49*  --   --   --  1.45*  TROPONINI 0.04* 0.04* 0.03* 0.03*  --  0.03*  --     Estimated Creatinine Clearance: 46.5 mL/min (by C-G formula based on SCr of 1.45 mg/dL).   Medical History: Past Medical History:  Diagnosis Date  . Coronary artery disease    a. s/p 2 ION stents to RCA in 11/2012 [facility] in [facility].  Marland Kitchen Hyperlipidemia   . Hypertension     Medications:  Prescriptions Prior to Admission  Medication Sig Dispense Refill Last Dose  . amLODipine (NORVASC) 2.5 MG tablet Take 2.5 mg by mouth 2 (two) times daily.   07/23/2016 at Unknown time  . aspirin 81 MG chewable tablet Chew 81 mg by mouth daily.   07/22/2016 at Unknown time  . clopidogrel (PLAVIX) 75 MG tablet Take 75 mg by mouth daily.   07/23/2016 at Unknown time  . co-enzyme Q-10 30 MG capsule Take 30 mg by mouth daily.   07/23/2016 at Unknown time  . ezetimibe (ZETIA) 10 MG tablet Take 5 mg by mouth daily.   07/23/2016 at Unknown time  . Flaxseed, Linseed, (FLAXSEED OIL PO) Take 1 tablet by mouth daily.   07/23/2016 at Unknown time  . metoprolol tartrate (LOPRESSOR) 25 MG tablet Take 12.5 mg by mouth 2 (two) times daily.    07/23/2016 at 0800  . omega-3 acid ethyl esters (LOVAZA) 1 g capsule Take 1 g by mouth daily.   07/23/2016 at Unknown time  . pantoprazole (PROTONIX) 40 MG tablet Take 40 mg by mouth once a week. Take one tablet by mouth three times a week on Mon wed and fridays   07/23/2016 at Unknown time   Scheduled:  . amLODipine  7.5 mg Oral Daily  . aspirin  81 mg Oral Daily  . clopidogrel  75 mg Oral Daily  . ezetimibe  5 mg Oral Daily  . metoprolol tartrate  12.5 mg Oral BID  . omega-3 acid ethyl esters  1 g Oral Daily  . [START ON 07/26/2016] pantoprazole  40 mg Oral Q M,W,F  . rosuvastatin  20 mg Oral q1800  . sodium chloride flush  3 mL Intravenous Q12H   Infusions:  . heparin 900 Units/hr (07/25/16 0400)  Assessment: 70yo with history of HTN and CAD presents with SOB/jaw pain, on IV heparin for ACS, likely cath on Monday.  Heparin level = 0.44, therapeutic on 900 units/hr, CBC stable, No bleeding noted per chart.  Goal of Therapy:  Heparin level 0.3-0.7 units/ml Monitor platelets by anticoagulation protocol: Yes   Plan:  Continue IV heparin infusion at 900 units/hr Daily heparin level and CBC F/u cath  Bayard HuggerMei Sheriann Newmann, PharmD, BCPS  Clinical Pharmacist  Pager: [phone]   07/25/2016,9:41 AM

## 2016-07-25 NOTE — Progress Notes (Signed)
Patient refused to take 2200 dose of Metoprolol d/t HR being 55. Patient states he was on 12.5mg  BID in the past but it HR was low with it and he didn't feel good so the cardiologist changed it to once daily and told him if his HR was less than 55 to stop it all together. I let him know I had spoke to Dr. Orson AloeHenderson and he said it would be okay, but he still didn't want to take it.

## 2016-07-25 NOTE — Progress Notes (Addendum)
Pt assisted up to bathroom, (pt instructed not to strain for BM, states he won't and did not)--after bm pt states he felt little flush and felt some of the chest pressure again like before at home.  NTG drip was stopped last night due to low HR,  Current HR 70 and BP 144/82, NTG drip cut back on, am meds given including metoprolol am dose (night dose was held last hs due to low BP and pt states he had stopped it at home per MD if HR was 55 or less).   Will inform Card MD/PA  this am.

## 2016-07-25 NOTE — Progress Notes (Signed)
Dr Antoine PocheHochrein notified here on the unit, agreed to restart NTG drip, at this time pt has no pressure, no pain, no flush feeling.

## 2016-07-26 ENCOUNTER — Encounter (HOSPITAL_COMMUNITY)
Admission: EM | Disposition: A | Discharge status: Home / Self Care | Payer: Self-pay | Source: Home / Self Care | Attending: Cardiology

## 2016-07-26 DIAGNOSIS — I2510 Atherosclerotic heart disease of native coronary artery without angina pectoris: Secondary | ICD-10-CM | POA: Diagnosis present

## 2016-07-26 DIAGNOSIS — I25110 Atherosclerotic heart disease of native coronary artery with unstable angina pectoris: Principal | ICD-10-CM

## 2016-07-26 HISTORY — PX: CARDIAC CATHETERIZATION: SHX172

## 2016-07-26 LAB — BASIC METABOLIC PANEL
Anion gap: 8 (ref 5–15)
BUN: 21 mg/dL — AB (ref 6–20)
CALCIUM: 8.9 mg/dL (ref 8.9–10.3)
CO2: 24 mmol/L (ref 22–32)
Chloride: 107 mmol/L (ref 101–111)
Creatinine, Ser: 1.37 mg/dL — ABNORMAL HIGH (ref 0.61–1.24)
GFR calc Af Amer: 59 mL/min — ABNORMAL LOW (ref >60)
GFR, EST NON AFRICAN AMERICAN: 51 mL/min — AB (ref >60)
Glucose, Bld: 103 mg/dL — ABNORMAL HIGH (ref 65–99)
POTASSIUM: 3.9 mmol/L (ref 3.5–5.1)
SODIUM: 139 mmol/L (ref 135–145)

## 2016-07-26 LAB — POCT ACTIVATED CLOTTING TIME: Activated Clotting Time: 510 seconds

## 2016-07-26 LAB — CBC
HEMATOCRIT: 44 % (ref 39.0–52.0)
Hemoglobin: 14.4 g/dL (ref 13.0–17.0)
MCH: 30.5 pg (ref 26.0–34.0)
MCHC: 32.7 g/dL (ref 30.0–36.0)
MCV: 93.2 fL (ref 78.0–100.0)
PLATELETS: 210 10*3/uL (ref 150–400)
RBC: 4.72 MIL/uL (ref 4.22–5.81)
RDW: 13 % (ref 11.5–15.5)
WBC: 8.7 10*3/uL (ref 4.0–10.5)

## 2016-07-26 LAB — HEPARIN LEVEL (UNFRACTIONATED): HEPARIN UNFRACTIONATED: 0.4 [IU]/mL (ref 0.30–0.70)

## 2016-07-26 SURGERY — LEFT HEART CATH AND CORONARY ANGIOGRAPHY
Anesthesia: LOCAL

## 2016-07-26 MED ORDER — BIVALIRUDIN BOLUS VIA INFUSION - CUPID
INTRAVENOUS | Status: DC | PRN
  Administered 2016-07-26: 55.425 mg via INTRAVENOUS

## 2016-07-26 MED ORDER — HEPARIN SODIUM (PORCINE) 1000 UNIT/ML IJ SOLN
INTRAMUSCULAR | Status: DC | PRN
  Administered 2016-07-26: 3700 [IU] via INTRAVENOUS

## 2016-07-26 MED ORDER — ACETAMINOPHEN 325 MG PO TABS: 650.0000 mg | ORAL_TABLET | ORAL | Status: DC | PRN

## 2016-07-26 MED ORDER — HEPARIN (PORCINE) IN NACL 2-0.9 UNIT/ML-% IJ SOLN
INTRAMUSCULAR | Status: AC
  Filled 2016-07-26: qty 1000

## 2016-07-26 MED ORDER — IOPAMIDOL (ISOVUE-370) INJECTION 76%
INTRAVENOUS | Status: AC
  Filled 2016-07-26: qty 100

## 2016-07-26 MED ORDER — MIDAZOLAM HCL 2 MG/2ML IJ SOLN
INTRAMUSCULAR | Status: AC
  Filled 2016-07-26: qty 2

## 2016-07-26 MED ORDER — SODIUM CHLORIDE 0.9 % IV SOLN
INTRAVENOUS | Status: DC | PRN
  Administered 2016-07-26: 1.75 mg/kg/h via INTRAVENOUS

## 2016-07-26 MED ORDER — SODIUM CHLORIDE 0.9 % IV SOLN: 250.0000 mL | INTRAVENOUS | Status: DC | PRN

## 2016-07-26 MED ORDER — SODIUM CHLORIDE 0.9% FLUSH
3.0000 mL | Freq: Two times a day (BID) | INTRAVENOUS | Status: DC
  Administered 2016-07-26: 3 mL via INTRAVENOUS

## 2016-07-26 MED ORDER — ZOLPIDEM TARTRATE 5 MG PO TABS: 5.0000 mg | ORAL_TABLET | Freq: Every evening | ORAL | Status: DC | PRN

## 2016-07-26 MED ORDER — SODIUM CHLORIDE 0.9 % IV SOLN
INTRAVENOUS | Status: DC
  Administered 2016-07-26 – 2016-07-27 (×2): via INTRAVENOUS

## 2016-07-26 MED ORDER — CLOPIDOGREL BISULFATE 75 MG PO TABS
75.0000 mg | ORAL_TABLET | Freq: Every day | ORAL | Status: DC
  Administered 2016-07-27: 75 mg via ORAL
  Filled 2016-07-26: qty 1

## 2016-07-26 MED ORDER — SODIUM CHLORIDE 0.9% FLUSH: 3.0000 mL | INTRAVENOUS | Status: DC | PRN

## 2016-07-26 MED ORDER — MORPHINE SULFATE (PF) 2 MG/ML IV SOLN: 2.0000 mg | INTRAVENOUS | Status: DC | PRN

## 2016-07-26 MED ORDER — ALPRAZOLAM 0.5 MG PO TABS: 1.0000 mg | ORAL_TABLET | Freq: Two times a day (BID) | ORAL | Status: DC | PRN

## 2016-07-26 MED ORDER — ONDANSETRON HCL 4 MG/2ML IJ SOLN: 4.0000 mg | Freq: Four times a day (QID) | INTRAMUSCULAR | Status: DC | PRN

## 2016-07-26 MED ORDER — FENTANYL CITRATE (PF) 100 MCG/2ML IJ SOLN
INTRAMUSCULAR | Status: DC | PRN
  Administered 2016-07-26: 25 ug via INTRAVENOUS
  Administered 2016-07-26: 50 ug via INTRAVENOUS

## 2016-07-26 MED ORDER — ANGIOPLASTY BOOK
Freq: Once | Status: AC
  Administered 2016-07-26: 21:00:00
  Filled 2016-07-26: qty 1

## 2016-07-26 MED ORDER — ASPIRIN 81 MG PO CHEW
81.0000 mg | CHEWABLE_TABLET | Freq: Every day | ORAL | Status: DC
  Administered 2016-07-27: 81 mg via ORAL
  Filled 2016-07-26: qty 1

## 2016-07-26 MED ORDER — VERAPAMIL HCL 2.5 MG/ML IV SOLN
INTRAVENOUS | Status: DC | PRN
  Administered 2016-07-26: 10 mL via INTRA_ARTERIAL

## 2016-07-26 MED ORDER — LIDOCAINE HCL (PF) 1 % IJ SOLN
INTRAMUSCULAR | Status: AC
  Filled 2016-07-26: qty 30

## 2016-07-26 MED ORDER — HEPARIN SODIUM (PORCINE) 1000 UNIT/ML IJ SOLN
INTRAMUSCULAR | Status: AC
  Filled 2016-07-26: qty 1

## 2016-07-26 MED ORDER — MIDAZOLAM HCL 2 MG/2ML IJ SOLN
INTRAMUSCULAR | Status: DC | PRN
Start: 2016-07-26 — End: 2016-07-26
  Administered 2016-07-26: 2 mg via INTRAVENOUS
  Administered 2016-07-26: 1 mg via INTRAVENOUS

## 2016-07-26 MED ORDER — NITROGLYCERIN 1 MG/10 ML FOR IR/CATH LAB
INTRA_ARTERIAL | Status: DC | PRN
  Administered 2016-07-26: 200 ug via INTRACORONARY

## 2016-07-26 MED ORDER — HEPARIN (PORCINE) IN NACL 2-0.9 UNIT/ML-% IJ SOLN
INTRAMUSCULAR | Status: DC | PRN
  Administered 2016-07-26: 1500 mL

## 2016-07-26 MED ORDER — CLOPIDOGREL BISULFATE 300 MG PO TABS
ORAL_TABLET | ORAL | Status: DC | PRN
  Administered 2016-07-26: 150 mg via ORAL

## 2016-07-26 MED ORDER — BIVALIRUDIN 250 MG IV SOLR
INTRAVENOUS | Status: AC
  Filled 2016-07-26: qty 250

## 2016-07-26 MED ORDER — CLOPIDOGREL BISULFATE 75 MG PO TABS
ORAL_TABLET | ORAL | Status: AC
  Filled 2016-07-26: qty 1

## 2016-07-26 MED ORDER — IOPAMIDOL (ISOVUE-370) INJECTION 76%
INTRAVENOUS | Status: DC | PRN
  Administered 2016-07-26: 200 mL via INTRA_ARTERIAL

## 2016-07-26 MED ORDER — NITROGLYCERIN 1 MG/10 ML FOR IR/CATH LAB
INTRA_ARTERIAL | Status: AC
  Filled 2016-07-26: qty 10

## 2016-07-26 MED ORDER — FENTANYL CITRATE (PF) 100 MCG/2ML IJ SOLN
INTRAMUSCULAR | Status: AC
  Filled 2016-07-26: qty 2

## 2016-07-26 MED ORDER — LIDOCAINE HCL (PF) 1 % IJ SOLN
INTRAMUSCULAR | Status: DC | PRN
  Administered 2016-07-26: 2 mL

## 2016-07-26 SURGICAL SUPPLY — 18 items
BALLN EMERGE MR 2.5X12 (BALLOONS) ×2
BALLOON EMERGE MR 2.5X12 (BALLOONS) ×1 IMPLANT
CATH INFINITI 5FR ANG PIGTAIL (CATHETERS) ×2 IMPLANT
CATH OPTITORQUE TIG 4.0 5F (CATHETERS) ×2 IMPLANT
CATH VISTA GUIDE 6FR JL4 (CATHETERS) ×2 IMPLANT
CATH VISTA GUIDE 6FR XB3.5 (CATHETERS) ×2 IMPLANT
DEVICE RAD COMP TR BAND LRG (VASCULAR PRODUCTS) ×2 IMPLANT
GLIDESHEATH SLEND SS 6F .021 (SHEATH) ×2 IMPLANT
GUIDE CATH RUNWAY 6FR CLS3.5 (CATHETERS) ×2 IMPLANT
KIT ENCORE 26 ADVANTAGE (KITS) ×2 IMPLANT
KIT HEART LEFT (KITS) ×2 IMPLANT
PACK CARDIAC CATHETERIZATION (CUSTOM PROCEDURE TRAY) ×2 IMPLANT
STENT XIENCE ALPINE RX 3.0X15 (Permanent Stent) ×2 IMPLANT
SYR MEDRAD MARK V 150ML (SYRINGE) ×2 IMPLANT
TRANSDUCER W/STOPCOCK (MISCELLANEOUS) ×2 IMPLANT
TUBING CIL FLEX 10 FLL-RA (TUBING) ×2 IMPLANT
WIRE EMERALD 3MM-J .035X260CM (WIRE) ×2 IMPLANT
WIRE PT2 MS 185 (WIRE) ×2 IMPLANT

## 2016-07-26 NOTE — Progress Notes (Signed)
       Patient Name: Todd CoriaJohn A Hosang Date of Encounter: 07/26/2016    SUBJECTIVE: Very nice gentleman who is being attended by his wife. No recurrence of chest discomfort since admission on Friday. Known history of underlying coronary artery disease. Prior procedures and interventions have been done elsewhere [facility]([place]).  TELEMETRY:  Normal sinus rhythm/sinus bradycardia Vitals:   07/26/16 0400 07/26/16 0853 07/26/16 0947 07/26/16 0948  BP: 113/69 (!) 161/100 (!) 153/91 (!) 153/91  Pulse: (!) 49 62 60   Resp: 12 19    Temp:  97.8 F (36.6 C)    TempSrc:  Oral    SpO2: 93% 98%    Weight:      Height:        Intake/Output Summary (Last 24 hours) at 07/26/16 1025 Last data filed at 07/26/16 0854  Gross per 24 hour  Intake          1313.68 ml  Output             1500 ml  Net          -186.32 ml   LABS: Basic Metabolic Panel:  Recent Labs  16/08/9608/10/17 0221 07/26/16 0312  NA 138 139  K 4.0 3.9  CL 107 107  CO2 24 24  GLUCOSE 108* 103*  BUN 20 21*  CREATININE 1.45* 1.37*  CALCIUM 9.1 8.9   CBC:  Recent Labs  07/24/16 0044 07/26/16 0312  WBC 8.3 8.7  HGB 13.7 14.4  HCT 41.3 44.0  MCV 92.4 93.2  PLT 199 210   Cardiac Enzymes:  Recent Labs  07/24/16 0044 07/24/16 0710 07/24/16 1228  TROPONINI 0.03* 0.03* 0.03*   BNP: Invalid input(s): POCBNP Hemoglobin A1C:  Recent Labs  07/24/16 0044  HGBA1C 5.7*   Fasting Lipid Panel:  Recent Labs  07/24/16 0044  CHOL 213*  HDL 41  LDLCALC 130*  TRIG 212*  CHOLHDL 5.2   Echocardiogram 07/24/16: Study Conclusions  - Left ventricle: The cavity size was normal. Wall thickness was   normal. Systolic function was normal. The estimated ejection   fraction was in the range of 55% to 60%. Wall motion was normal;   there were no regional wall motion abnormalities. Doppler   parameters are consistent with abnormal left ventricular   relaxation (grade 1 diastolic dysfunction). - Left atrium: The atrium was  mildly dilated. Radiology/Studies:  No new data  Physical Exam: Blood pressure (!) 153/91, pulse 60, temperature 97.8 F (36.6 C), temperature source Oral, resp. rate 19, height 5\' 8"  (1.727 m), weight 162 lb 14.7 oz (73.9 kg), SpO2 98 %. Weight change: 1 lb 1.6 oz (0.5 kg)  Wt Readings from Last 3 Encounters:  07/26/16 162 lb 14.7 oz (73.9 kg)   Chest is clear. Bilateral radial pulses. Cardiac exam is unremarkable Extremities reveal no edema  ASSESSMENT:  1. Accelerating angina without evidence of infarction. 2. History of coronary artery disease with prior PCI using bare metal stents. 3. Hypertension 4. Hyperlipidemia  Plan:  1. We discussed coronary angiography. 2. Further management will be pending findings at angiography.  Signed, Lyn RecordsHenry W Smith III 07/26/2016, 10:25 AM

## 2016-07-26 NOTE — Care Management Note (Addendum)
Case Management Note  Patient Details  Name: Todd Mills MRN: [id number] Date of Birth: 01-19-1946  Subjective/Objective:     Adm w angina, for cath s/p intervention, on plavix, NCM will cont to follow for dc needs.               Action/Plan:lives w wife   Expected Discharge Date:                  Expected Discharge Plan:  Home/Self Care  In-House Referral:     Discharge planning Services     Post Acute Care Choice:    Choice offered to:     DME Arranged:    DME Agency:     HH Arranged:    HH Agency:     Status of Service:  In process, will continue to follow  If discussed at Long Length of Stay Meetings, dates discussed:    Additional Comments: moniter for dc needs but none anticipated at present.  Hanley Haysowell, Gioia Ranes T, RN 07/26/2016, 10:48 AM

## 2016-07-26 NOTE — Progress Notes (Signed)
Called report to W. R. BerkleyJohn RN on 6C.  Patient transferred from Cath lab to [facility], wife aware of transfer no belongings in room.

## 2016-07-26 NOTE — Progress Notes (Signed)
ANTICOAGULATION CONSULT NOTE - Initial Consult  Pharmacy Consult for heparin Indication: chest pain/ACS  No Known Allergies  Patient Measurements: Height: 5\' 8"  (172.7 cm) Weight: 162 lb 14.7 oz (73.9 kg) IBW/kg (Calculated) : 68.4 Heparin Dosing Weight: 75.6 kg  Vital Signs: Temp: 98.3 F (36.8 C) (09/11 0345) Temp Source: Oral (09/11 0345) BP: 113/69 (09/11 0400) Pulse Rate: 49 (09/11 0400)  Labs:  Recent Labs  07/23/16 1546 07/23/16 2232  07/24/16 0044 07/24/16 0710 07/24/16 0914 07/24/16 1228 07/25/16 0221 07/26/16 0311 07/26/16 0312  HGB 14.9  --   --  13.7  --   --   --   --   --  14.4  HCT 44.7  --   --  41.3  --   --   --   --   --  44.0  PLT 218  --   --  199  --   --   --   --   --  210  LABPROT  --  13.8  --   --   --   --   --   --   --   --   INR  --  1.06  --   --   --   --   --   --   --   --   HEPARINUNFRC  --   --   < > 0.53  --  0.50  --  0.44 0.40  --   CREATININE 1.65*  --   --  1.49*  --   --   --  1.45*  --  1.37*  TROPONINI 0.04* 0.04*  --  0.03* 0.03*  --  0.03*  --   --   --   < > = values in this interval not displayed.  Estimated Creatinine Clearance: 49.2 mL/min (by C-G formula based on SCr of 1.37 mg/dL).   Medical History: Past Medical History:  Diagnosis Date  . Coronary artery disease    a. s/p 2 ION stents to RCA in 11/2012 [facility] [place]  Marland Kitchen. Hyperlipidemia   . Hypertension     Medications:  Prescriptions Prior to Admission  Medication Sig Dispense Refill Last Dose  . amLODipine (NORVASC) 2.5 MG tablet Take 2.5 mg by mouth 2 (two) times daily.   07/23/2016 at Unknown time  . aspirin 81 MG chewable tablet Chew 81 mg by mouth daily.   07/22/2016 at Unknown time  . clopidogrel (PLAVIX) 75 MG tablet Take 75 mg by mouth daily.   07/23/2016 at Unknown time  . co-enzyme Q-10 30 MG capsule Take 30 mg by mouth daily.   07/23/2016 at Unknown time  . ezetimibe (ZETIA) 10 MG tablet Take 5 mg by mouth daily.   07/23/2016 at Unknown  time  . Flaxseed, Linseed, (FLAXSEED OIL PO) Take 1 tablet by mouth daily.   07/23/2016 at Unknown time  . metoprolol tartrate (LOPRESSOR) 25 MG tablet Take 12.5 mg by mouth 2 (two) times daily.   07/23/2016 at 0800  . omega-3 acid ethyl esters (LOVAZA) 1 g capsule Take 1 g by mouth daily.   07/23/2016 at Unknown time  . pantoprazole (PROTONIX) 40 MG tablet Take 40 mg by mouth once a week. Take one tablet by mouth three times a week on Mon wed and fridays   07/23/2016 at Unknown time   Scheduled:  . amLODipine  7.5 mg Oral Daily  . aspirin  81 mg Oral Daily  . clopidogrel  75 mg Oral Daily  . ezetimibe  5 mg Oral Daily  . metoprolol tartrate  12.5 mg Oral BID  . omega-3 acid ethyl esters  1 g Oral Daily  . pantoprazole  40 mg Oral Q M,W,F  . rosuvastatin  20 mg Oral q1800  . sodium chloride flush  3 mL Intravenous Q12H   Infusions:  . sodium chloride 1 mL/kg/hr (07/25/16 2000)  . heparin 900 Units/hr (07/25/16 2312)  . nitroGLYCERIN 5 mcg/min (07/25/16 0935)    Assessment: 70yo with history of HTN and CAD presents with SOB/jaw pain, on IV heparin for ACS, scheduled for cath today. Heparin level therapeutic at 0.40 on 900 units/hr, CBC stable, no bleeding noted per chart.  Goal of Therapy:  Heparin level 0.3-0.7 units/ml Monitor platelets by anticoagulation protocol: Yes   Plan:  -Continue IV heparin infusion at 900 units/hr -Daily heparin level and CBC -F/u plans post-cath lab  Fredonia Highland, PharmD PGY-1 Pharmacy Resident Pager: [phone]

## 2016-07-26 NOTE — Progress Notes (Signed)
TR BAND REMOVAL  LOCATION:    right radial  DEFLATED PER PROTOCOL:    Yes.    TIME BAND OFF / DRESSING APPLIED:    2130    SITE UPON ARRIVAL:    Level 0  SITE AFTER BAND REMOVAL:    Level 0  CIRCULATION SENSATION AND MOVEMENT:    Within Normal Limits   Yes.    COMMENTS:   Radial site teaching done; and teach back verified; verbalizes understanding.

## 2016-07-27 ENCOUNTER — Encounter (HOSPITAL_COMMUNITY): Payer: Self-pay | Admitting: Cardiovascular Disease

## 2016-07-27 ENCOUNTER — Telehealth: Payer: Self-pay | Admitting: Physician Assistant

## 2016-07-27 DIAGNOSIS — N183 Chronic kidney disease, stage 3 unspecified: Secondary | ICD-10-CM | POA: Diagnosis present

## 2016-07-27 LAB — CBC
HEMATOCRIT: 40.6 % (ref 39.0–52.0)
HEMOGLOBIN: 13.3 g/dL (ref 13.0–17.0)
MCH: 30.3 pg (ref 26.0–34.0)
MCHC: 32.8 g/dL (ref 30.0–36.0)
MCV: 92.5 fL (ref 78.0–100.0)
Platelets: 186 10*3/uL (ref 150–400)
RBC: 4.39 MIL/uL (ref 4.22–5.81)
RDW: 13.1 % (ref 11.5–15.5)
WBC: 7.9 10*3/uL (ref 4.0–10.5)

## 2016-07-27 LAB — BASIC METABOLIC PANEL
Anion gap: 6 (ref 5–15)
BUN: 18 mg/dL (ref 6–20)
CHLORIDE: 107 mmol/L (ref 101–111)
CO2: 24 mmol/L (ref 22–32)
CREATININE: 1.22 mg/dL (ref 0.61–1.24)
Calcium: 8.8 mg/dL — ABNORMAL LOW (ref 8.9–10.3)
GFR calc non Af Amer: 59 mL/min — ABNORMAL LOW (ref >60)
Glucose, Bld: 99 mg/dL (ref 65–99)
Potassium: 3.9 mmol/L (ref 3.5–5.1)
Sodium: 137 mmol/L (ref 135–145)

## 2016-07-27 MED ORDER — ROSUVASTATIN CALCIUM 20 MG PO TABS: 20.0000 mg | ORAL_TABLET | Freq: Every day | ORAL | 3 refills | Status: DC

## 2016-07-27 MED ORDER — NITROGLYCERIN 0.4 MG SL SUBL: 0.4000 mg | SUBLINGUAL_TABLET | SUBLINGUAL | 12 refills | Status: AC | PRN

## 2016-07-27 MED ORDER — AMLODIPINE BESYLATE 5 MG PO TABS: 7.5000 mg | ORAL_TABLET | Freq: Every day | ORAL | 3 refills | Status: DC

## 2016-07-27 MED ORDER — ROSUVASTATIN CALCIUM 20 MG PO TABS: 20.0000 mg | ORAL_TABLET | Freq: Every day | ORAL | 6 refills | Status: DC

## 2016-07-27 NOTE — Progress Notes (Signed)
Patient Name: Todd Mills Date of Encounter: 07/27/2016     Active Problems:   Unstable angina (HCC)   Coronary artery disease involving coronary bypass graft of native heart with unstable angina pectoris (HCC)   Hyperlipidemia LDL goal <70   HTN (hypertension), benign   Coronary artery disease    SUBJECTIVE  Feeling well. No more chest pain or SOB. Ready for discharge home.  CURRENT MEDS . amLODipine  7.5 mg Oral Daily  . aspirin  81 mg Oral Daily  . clopidogrel  75 mg Oral Q breakfast  . ezetimibe  5 mg Oral Daily  . metoprolol tartrate  12.5 mg Oral BID  . omega-3 acid ethyl esters  1 g Oral Daily  . pantoprazole  40 mg Oral Q M,W,F  . rosuvastatin  20 mg Oral q1800  . sodium chloride flush  3 mL Intravenous Q12H    OBJECTIVE  Vitals:   07/26/16 1945 07/26/16 2000 07/26/16 2100 07/27/16 0545  BP: (!) 141/87 136/75 135/82 (!) 114/53  Pulse: 67 68 66 (!) 57  Resp: 19 18 (!) 22 14  Temp: 97.8 F (36.6 C)   98.2 F (36.8 C)  TempSrc: Oral   Oral  SpO2: 98% 97% 97% 95%  Weight:    160 lb 15 oz (73 kg)  Height:        Intake/Output Summary (Last 24 hours) at 07/27/16 0659 Last data filed at 07/27/16 0545  Gross per 24 hour  Intake          2129.42 ml  Output             1625 ml  Net           504.42 ml   Filed Weights   07/25/16 0538 07/26/16 0345 07/27/16 0545  Weight: 161 lb 13.1 oz (73.4 kg) 162 lb 14.7 oz (73.9 kg) 160 lb 15 oz (73 kg)    PHYSICAL EXAM  General: Pleasant, NAD. Neuro: Alert and oriented X 3. Moves all extremities spontaneously. Psych: Normal affect. HEENT:  Normal  Neck: Supple without bruits or JVD. Lungs:  Resp regular and unlabored, CTA. Heart: RRR no s3, s4, or murmurs. Abdomen: Soft, non-tender, non-distended, BS + x 4.  Extremities: No clubbing, cyanosis or edema. DP/PT/Radials 2+ and equal bilaterally.  Accessory Clinical Findings  CBC  Recent Labs  07/26/16 0312 07/27/16 0517  WBC 8.7 7.9  HGB 14.4 13.3    HCT 44.0 40.6  MCV 93.2 92.5  PLT 210 186   Basic Metabolic Panel  Recent Labs  07/26/16 0312 07/27/16 0517  NA 139 137  K 3.9 3.9  CL 107 107  CO2 24 24  GLUCOSE 103* 99  BUN 21* 18  CREATININE 1.37* 1.22  CALCIUM 8.9 8.8*   Liver Function Tests No results for input(s): AST, ALT, ALKPHOS, BILITOT, PROT, ALBUMIN in the last 72 hours. No results for input(s): LIPASE, AMYLASE in the last 72 hours. Cardiac Enzymes  Recent Labs  07/24/16 0710 07/24/16 1228  TROPONINI 0.03* 0.03*    TELE  NSR with some sinus brady   Radiology/Studies  Dg Chest 2 View  Result Date: 07/23/2016 CLINICAL DATA:  Pain. EXAM: CHEST  2 VIEW COMPARISON:  No recent prior . FINDINGS: Mediastinum hilar structures normal. Mild left base subsegmental atelectasis and or infiltrate. No pleural effusion or pneumothorax. Heart size normal. No acute bony abnormality . IMPRESSION: Mild left base subsegmental atelectasis and or infiltrate. Electronically Signed   By: Maisie Fus  Register   On: 07/23/2016 16:14     Cardiac catheterization 07/26/16    Conclusion    Prox RCA to Mid RCA lesion, 20 %stenosed.  Dist RCA lesion, 25 %stenosed.  1st Diag-2 lesion, 20 %stenosed.  1st Diag-1 lesion, 20 %stenosed.  The left ventricular ejection fraction is 50-55% by visual estimate.  The left ventricular systolic function is normal.  1st Mrg-2 lesion, 20 %stenosed.  1st Mrg-1 lesion, 95 %stenosed.  Post intervention, there is a 0% residual stenosis.  A STENT XIENCE ALPINE RX 3.0X15 drug eluting stent was successfully placed.  Lat 1st Mrg lesion, 30 %stenosed.   Coronary obstructive disease with mild 20% proximal and mid diagonal stenoses in an LAD , mild luminal irregularity; large left circumflex system with a 95% focal stenosis in the proximal portion of the OM1 vessel prior to its bifurcation.  There was mild 20-30% narrowings in both the superior and inferior limb of this bifurcation; and patent RCA  stents  proximally to the region of the acute margin with mild 20% intimal hyperplasia in the proximal portion of the stented segment.   There is mild 25% distal RCA narrowing.  Successful PCI to the left circumflex marginal vessel with ultimate insertion of a 3.0 x 15 mm Xience Alpine DES stent dilated to 3.15 mm with a 95% stenosis being reduced to 0% and evidence for brisk TIMI-3 flow and no evidence for dissection.  RECOMMENDATION: The patient will continue on dual antiplatelet therapy.  He has been on chronic Plavix and Plavix was continued during the procedure.  He gets his medications at the [facility] in [place]. Medical therapy for mild concomitant CAD with  aggressive lipid intervention.    2D ECHO: 07/24/2016 LV EF: 55% -   60% Study Conclusions - Left ventricle: The cavity size was normal. Wall thickness was   normal. Systolic function was normal. The estimated ejection   fraction was in the range of 55% to 60%. Wall motion was normal;   there were no regional wall motion abnormalities. Doppler   parameters are consistent with abnormal left ventricular   relaxation (grade 1 diastolic dysfunction). - Left atrium: The atrium was mildly dilated.   ASSESSMENT AND PLAN Mr. Todd Mills is a 70 y/o M with history of CAD s/p 2 ION stents to prox RCA in 11/2012, HTN, HLD, former tobacco abuse who presented to [facility] on 07[facility]/16 with SOB/jaw discomfort. He is from [place] but has been living in [place] for several months. Stent card indicates moderate nonobstructive LAD and septal perforator disease. Reports repeat cath 2015 without PCI at that time. Nuc 12/2015 was unremarkable per his report.  Unstable angina: troponin with nondiagnostic trend .04--> 0.03. LHC on 07/26/16 revealed patents RCA stents, 20% mid RCA stenosis and 25% distal naorrowing, 20% occl prox-mid diag, large LCX w/ 95% focal stenosis in prox portion of OM1 vessel prior to bifurcation s/p PCI/DES. 2D ECHO showed normal LV  function with nor WMAs. Mild LAE. Continue ASA/plavix, statin and BB.   HTN: BP has been well controlled on current regimen  AKI: creat 1.45 on admission. Now improved to 1.22. Unclear baseline.  Hyperlipidemia: LDL 130. Intolerant to statins. Tolerating Crestor 20mg  daily here. Continue home Zetia 10 mg daily  Dispo: he will be returning to [place] in November. I will arrange for TOC follow up in our offices. I will also obtain a CD of his cath films for him to bring to his primary cardiologist in [place] when he returns  Signed, Cline CrockKathryn Thompson PA-C  Pager [phone] The patient has been seen in conjunction with Carlean JewsKatie Thompson, PAC. All aspects of care have been considered and discussed. The patient has been personally interviewed, examined, and all clinical data has been reviewed.   Digital images have been reviewed. Excellent angiographic result on the de novo circumflex stenosis. There is moderate in-stent restenosis in the right coronary.  Plan discharge today. Dual antiplatelet therapy. Follow-up in 7-10 days as T OC.  Forward information to the patient's primary cardiologist in [place].

## 2016-07-27 NOTE — Discharge Summary (Addendum)
Discharge Summary    Patient ID: Todd CoriaJohn A Reno,  MRN: [id number], DOB/AGE: 06-11-1946 70 y.o.  Admit date: 07/23/2016 Discharge date: 07/27/2016  Primary Care Provider: No PCP Per Patient Primary Cardiologist: Dr. Mendel Corninguner while in [place], [place]. His permanent residence is in [place], [place] where he is followed by a cardiologist. He returns in 09/2016.   Discharge Diagnoses    Principal Problem:   Unstable angina (HCC) Active Problems:   Hyperlipidemia LDL goal <70   HTN (hypertension), benign   Coronary artery disease   CKD (chronic kidney disease)   Allergies No Known Allergies   History of Present Illness     Mr. Cherlynn PerchesKetterman is a 70 y/o M with history of CAD s/p 2 ION stents to prox RCA in 11/2012, HTN, HLD, former tobacco abuse who [facility] 07[facility]16 with SOB/jaw discomfort. He is from [place] but has been living in [place] for several months. Stent card indicates moderate nonobstructive LAD and septal perforator disease. Reports repeat cath 2015 without PCI at that time. Nuc 12/2015 was unremarkable per his report.  For the 3 weeks prior to admission he reported generally not feeling well. Has noticed intermittent chest discomfort that would randomly creep up into his throat. While playing sports outside, he would notice exertional dyspnea as well as a sense of jaw tightness, somewhat reminiscent of prior angina. On the day of admission, he developed sx at rest noticed significant SOB even with minimal activity. Due to persistent sx, he presented [facility].   In the ER he was not tachycardic, tachypneic or hypoxic and was chest pain free. VSS except BP moderately elevated (166/100). He reported compliance with meds. CXR with mild left base subsegmental atelectasis and or infiltrate. No cough, fever noted. Rec'd 324mg  ASA by ED and started on heparin per pharmacy for troponin 0.04 and concern for [place]. Labs otherwise notable for Cr 1.65 of uncertain  chronicity.  Hospital Course     Consultants: none  Unstable angina: troponin with nondiagnostic trend .04--> 0.03. LHC on 07/26/16 revealed patents RCA stents, 20% mid RCA stenosis and 25% distal naorrowing, 20% occl prox-mid diag, large LCX w/ 95% focal stenosis in prox portion of OM1 vessel prior to bifurcation s/p PCI/DES. 2D ECHO showed normal LV function with no WMAs. Mild LAE. Continue ASA/plavix, statin and BB.   HTN: BP was elevated on admission and amlodipine was increased to 7.5mg  daily. Now well controlled. BB unable to be titrated due to bradycardia.   AKI: creat 1.45 on admission. Now improved to 1.22. Unclear baseline.  Hyperlipidemia: LDL 130. Goal <70. Previously intolerant to statins. We have re-challenged him with Crestor 20mg  daily here. Tolerating okay so far. Continue home Zetia 10 mg daily and fish oil.   Dispo: he will be returning to [place] in November. I have arranged for a 1 week TOC follow up in our offices. I will also obtain a CD of his cath films with copy of DC summary for him to bring to his primary cardiologist in [place] when he returns   The patient has had an uncomplicated hospital course and is recovering well. The radial catheter site is stable He has been seen by Dr. Katrinka BlazingSmith today and deemed ready for discharge home. All follow-up appointments have been scheduled. Discharge medications are listed below.  _____________  Discharge Vitals Blood pressure (!) 158/88, pulse 71, temperature 98.2 F (36.8 C), temperature source Oral, resp. rate 16, height 5\' 8"  (1.727  m), weight 160 lb 15 oz (73 kg), SpO2 95 %.  Filed Weights   07/25/16 0538 07/26/16 0345 07/27/16 0545  Weight: 161 lb 13.1 oz (73.4 kg) 162 lb 14.7 oz (73.9 kg) 160 lb 15 oz (73 kg)    Labs & Radiologic Studies     CBC  Recent Labs  07/26/16 0312 07/27/16 0517  WBC 8.7 7.9  HGB 14.4 13.3  HCT 44.0 40.6  MCV 93.2 92.5  PLT 210 186   Basic Metabolic Panel  Recent Labs   16/10/96 0312 07/27/16 0517  NA 139 137  K 3.9 3.9  CL 107 107  CO2 24 24  GLUCOSE 103* 99  BUN 21* 18  CREATININE 1.37* 1.22  CALCIUM 8.9 8.8*   Liver Function Tests No results for input(s): AST, ALT, ALKPHOS, BILITOT, PROT, ALBUMIN in the last 72 hours. No results for input(s): LIPASE, AMYLASE in the last 72 hours. Cardiac Enzymes  Recent Labs  07/24/16 1228  TROPONINI 0.03*    Dg Chest 2 View  Result Date: 07/23/2016 CLINICAL DATA:  Pain. EXAM: CHEST  2 VIEW COMPARISON:  No recent prior . FINDINGS: Mediastinum hilar structures normal. Mild left base subsegmental atelectasis and or infiltrate. No pleural effusion or pneumothorax. Heart size normal. No acute bony abnormality . IMPRESSION: Mild left base subsegmental atelectasis and or infiltrate. Electronically Signed   By: Maisie Fus  Register   On: 07/23/2016 16:14     Diagnostic Studies/Procedures    Cardiac catheterization 07/26/16    Conclusion    Prox RCA to Mid RCA lesion, 20 %stenosed.  Dist RCA lesion, 25 %stenosed.  1st Diag-2 lesion, 20 %stenosed.  1st Diag-1 lesion, 20 %stenosed.  The left ventricular ejection fraction is 50-55% by visual estimate.  The left ventricular systolic function is normal.  1st Mrg-2 lesion, 20 %stenosed.  1st Mrg-1 lesion, 95 %stenosed.  Post intervention, there is a 0% residual stenosis.  A STENT XIENCE ALPINE RX 3.0X15 drug eluting stent was successfully placed.  Lat 1st Mrg lesion, 30 %stenosed.  Coronary obstructive disease with mild 20% proximal and mid diagonal stenoses in an LAD , mild luminal irregularity; large left circumflex system with a 95% focal stenosis in the proximal portion of the OM1 vessel prior to its bifurcation. There was mild 20-30% narrowings in both the superior and inferior limb of this bifurcation; and patent RCA stents proximally to the region of the acute margin with mild 20% intimal hyperplasia in the proximal portion of the stented  segment. There is mild 25% distal RCA narrowing.  Successful PCI to the left circumflex marginal vessel with ultimate insertion of a 3.0 x 15 mm Xience Alpine DES stent dilated to 3.15 mm with a 95% stenosis being reduced to 0% and evidence for brisk TIMI-3 flow and no evidence for dissection.  RECOMMENDATION: The patient will continue on dual antiplatelet therapy. He has been on chronic Plavix and Plavix was continued during the procedure. He gets his medications at the [facility] in [place]. Medical therapy for mild concomitant CAD with aggressive lipid intervention.    _____________   2D ECHO: 07/24/2016 LV EF: 55% - 60% Study Conclusions - Left ventricle: The cavity size was normal. Wall thickness was normal. Systolic function was normal. The estimated ejection fraction was in the range of 55% to 60%. Wall motion was normal; there were no regional wall motion abnormalities. Doppler parameters are consistent with abnormal left ventricular relaxation (grade 1 diastolic dysfunction). - Left atrium: The atrium was mildly  dilated.    Disposition   Pt is being discharged home today in good condition.  Follow-up Plans & Appointments    Follow-up Information    Cline Crock, PA-C. Go on 08/03/2016.   Specialties:  Cardiology, Radiology Why:  @ 3pm ( please show up 15 minutes early)  Contact information: [address] [place] [address] [phone]            Discharge Medications     Medication List    TAKE these medications   amLODipine 5 MG tablet Commonly known as:  NORVASC Take 1.5 tablets (7.5 mg total) by mouth daily. What changed:  medication strength  how much to take  when to take this   aspirin 81 MG chewable tablet Chew 81 mg by mouth daily.   clopidogrel 75 MG tablet Commonly known as:  PLAVIX Take 75 mg by mouth daily.   co-enzyme Q-10 30 MG capsule Take 30 mg by mouth daily.   ezetimibe 10  MG tablet Commonly known as:  ZETIA Take 5 mg by mouth daily.   FLAXSEED OIL PO Take 1 tablet by mouth daily.   metoprolol tartrate 25 MG tablet Commonly known as:  LOPRESSOR Take 12.5 mg by mouth 2 (two) times daily.   nitroGLYCERIN 0.4 MG SL tablet Commonly known as:  NITROSTAT Place 1 tablet (0.4 mg total) under the tongue every 5 (five) minutes x 3 doses as needed for chest pain.   omega-3 acid ethyl esters 1 g capsule Commonly known as:  LOVAZA Take 1 g by mouth daily.   pantoprazole 40 MG tablet Commonly known as:  PROTONIX Take 40 mg by mouth once a week. Take one tablet by mouth three times a week on Mon wed and fridays   rosuvastatin 20 MG tablet Commonly known as:  CRESTOR Take 1 tablet (20 mg total) by mouth daily at 6 PM.        Outstanding Labs/Studies   None   Duration of Discharge Encounter   Greater than 30 minutes including physician time.  Signed, Cline Crock PA-C 07/27/2016, 9:23 AM  The patient has been seen in conjunction with Carlean Jews, PAC. All aspects of care have been considered and discussed. The patient has been personally interviewed, examined, and all clinical data has been reviewed.   Digital images have been reviewed. Excellent angiographic result on the de novo circumflex stenosis. There is moderate in-stent restenosis in the right coronary.  Kidney function is normalized since admission  Plan discharge today. Dual antiplatelet therapy. Follow-up in 7-10 days as T OC.  Forward information to the patient's primary cardiologist in [place].

## 2016-07-27 NOTE — Discharge Instructions (Signed)

## 2016-07-27 NOTE — Care Management Important Message (Signed)
Important Message  Patient Details  Name: Todd CoriaJohn A Mills MRN: [id number] Date of Birth: 1946-04-06   Medicare Important Message Given:  Yes    Amarissa Koerner Stefan ChurchBratton 07/27/2016, 12:54 PM

## 2016-07-27 NOTE — Progress Notes (Signed)
CARDIAC REHAB PHASE I   PRE:  Rate/Rhythm: 71 SR  BP:  Supine:   Sitting: 158/88  Standing:    SaO2:   MODE:  Ambulation: 1000 ft   POST:  Rate/Rhythm: 78 SR  BP:  Supine:   Sitting: 167/83  Standing:    SaO2:  [id number] Pt walked 1000 ft with no CP. Tolerated well. Education completed with pt who voiced understanding. Discussed CRP 2 for short time that he is in [place] and will refer to [place]. Should be here until NOV. Stressed importance of plavix with stent, reviewed NTG use, ex ed, risk factors and heart healthy diet.    Luetta Nuttingharlene Tawnia Schirm, RN BSN  07/27/2016 9:29 AM

## 2016-07-27 NOTE — Care Management Note (Signed)
Case Management Note  Patient Details  Name: Todd CoriaJohn A Mills MRN: [id number] Date of Birth: August 02, 1946  Subjective/Objective:    Adm w angina, for cath s/p intervention, on plavix, for dc today, no needs.                Action/Plan:   Expected Discharge Date:                  Expected Discharge Plan:  Home/Self Care  In-House Referral:     Discharge planning Services  CM Consult  Post Acute Care Choice:    Choice offered to:     DME Arranged:    DME Agency:     HH Arranged:    HH Agency:     Status of Service:  Completed, signed off  If discussed at [employer] Length of Stay Meetings, dates discussed:    Additional Comments:  Leone Havenaylor, Kima Malenfant [place], RN 07/27/2016, 12:07 PM

## 2016-07-27 NOTE — Telephone Encounter (Signed)
9/19 @ 3:00pm with Carlean JewsKatie Thompson TOC

## 2016-07-28 MED FILL — Clopidogrel Bisulfate Tab 75 MG (Base Equiv): ORAL | Qty: 2 | Status: AC

## 2016-07-28 NOTE — Telephone Encounter (Signed)
Patient contacted regarding discharge from [facility] on 07/27/16.  Patient understands to follow up with provider Cathlean CowerKatie Thompson,PA on 08/03/16 at 3:[facility] Patient understands discharge instructions? yes Patient understands medications and regiment? yes Patient understands to bring all medications to this visit? Yes  States he is feeling good.  Denies SOB or CP.  Has all of his medications and verbalizes understanding on directions to take them.  Is taking his Plavix and ASA.  Reviewed all of his medications with him.  Advised to bring medications with him to OV.  Answered all questions and advised to call if has any concerns.

## 2016-08-02 ENCOUNTER — Encounter: Payer: Self-pay | Admitting: Physician Assistant

## 2016-08-02 NOTE — Progress Notes (Signed)
Cardiology Office Note    Date:  08/03/2016   ID:  Todd Mills, DOB 1946/03/18, MRN [id number]  PCP:  No PCP Per Patient  Cardiologist:  Dr. Mayford Knife while in [place], [place]. His permanent residence is in [place], [place] where he is followed by a cardiologist. He returns in 09/2016.  CC: post hospital follow up   History of Present Illness:  Todd Mills is a 70 y.o. male with a history of CAD s/p 2 ION stents to prox RCA in 11/2012, HTN, HLD, former tobacco abuse and recent admission for unstable angina who presents to clinic for post hospital follow up.   He presented to [facility] on 9/8/17with SOB/jaw discomfort. He is from [place] but has been living in [place] for several months. Stent card indicates moderate nonobstructive LAD and septal perforator disease. Reports repeat cath 2015 without PCI at that time. Nuc 12/2015 was unremarkable per his report.   He was admitted for unstable angina and underwent cath on 07/26/16 which revealed patents RCA stents, 20% mid RCA stenosis and 25% distal naorrowing, 20% occl prox-mid diag, large LCX w/ 95% focal stenosis in prox portion of OM1 vessel prior to bifurcation s/p PCI/DES. 2D ECHO showed normal LV function with no WMAs, and mild LAE. He was continued on ASA and plavix. Amlodipine increased to 7.5mg  daily 2/2 HTN.   Today he presents to clinic for follow up. He felt a little weak and nauseated yesterday while working out on the yard in the heat. Since leaving the hospital he has been feeling fatigued but better than when we came in. No more chest pain or SOB. No LE edema, orthopnea or PND. No dizziness or syncope. No claudication. Walked 30 minutes on the treadmill this AM and did just great. He has had no issues with Crestor. His BP has been soft on a couple occasions and his HR low. He wonders if that could cause him to be tired.    Past Medical History:  Diagnosis Date  . CKD (chronic kidney disease)   . Coronary artery disease    a. s/p 2  ION stents to RCA in 11/2012 [facility] hospital in [facility].  Marland Kitchen Hyperlipidemia   . Hypertension     Past Surgical History:  Procedure Laterality Date  . CARDIAC CATHETERIZATION N/A 07/26/2016   Procedure: Left Heart Cath and Coronary Angiography;  Surgeon: Lennette Bihari, MD;  Location: [facility] INVASIVE CV LAB;  Service: Cardiovascular;  Laterality: N/A;  . CARDIAC CATHETERIZATION N/A 07/26/2016   Procedure: Coronary Stent Intervention;  Surgeon: Lennette Bihari, MD;  Location: [facility] INVASIVE CV LAB;  Service: Cardiovascular;  Laterality: N/A;  . HERNIA REPAIR     Multiple hernia repairs    Current Medications: Outpatient Medications Prior to Visit  Medication Sig Dispense Refill  . aspirin 81 MG chewable tablet Chew 81 mg by mouth daily.    . clopidogrel (PLAVIX) 75 MG tablet Take 75 mg by mouth daily.    Marland Kitchen co-enzyme Q-10 30 MG capsule Take 30 mg by mouth daily.    Marland Kitchen ezetimibe (ZETIA) 10 MG tablet Take 5 mg by mouth daily.    . Flaxseed, Linseed, (FLAXSEED OIL PO) Take 1 tablet by mouth daily.    . metoprolol tartrate (LOPRESSOR) 25 MG tablet Take 12.5 mg by mouth every morning.    . nitroGLYCERIN (NITROSTAT) 0.4 MG SL tablet Place 1 tablet (0.4 mg total) under the tongue every 5 (five) minutes x 3 doses as needed  for chest pain. 25 tablet 12  . omega-3 acid ethyl esters (LOVAZA) 1 g capsule Take 1 g by mouth daily.    . pantoprazole (PROTONIX) 40 MG tablet Take one tablet by mouth three times a week on Mon wed and fridays     . rosuvastatin (CRESTOR) 20 MG tablet Take 1 tablet (20 mg total) by mouth daily at 6 PM. 30 tablet 6  . amLODipine (NORVASC) 5 MG tablet Take 1.5 tablets (7.5 mg total) by mouth daily. 45 tablet 3   No facility-administered medications prior to visit.      Allergies:   Review of patient's allergies indicates no known allergies.   Social History   Social History  . Marital status: Married    Spouse name: N/A  . Number of children: N/A  . Years of education: N/A    Social History Main Topics  . Smoking status: Former Games developer  . Smokeless tobacco: Never Used     Comment: Quit 1980s  . Alcohol use Yes  . Drug use: No  . Sexual activity: Not Asked   Other Topics Concern  . None   Social History Narrative  . None     Family History:  The patient's family history includes Other in his father and mother.     ROS:   Please see the history of present illness.    ROS All other systems reviewed and are negative.   PHYSICAL EXAM:   VS:  BP 127/66   Pulse (!) 58   Ht 5\' 8"  (1.727 m)   Wt 165 lb 6.4 oz (75 kg)   SpO2 98%   BMI 25.15 kg/m    GEN: Well nourished, well developed, in no acute distress  HEENT: normal  Neck: no JVD, carotid bruits, or masses Cardiac: RR, brady; no murmurs, rubs, or gallops,no edema  Respiratory:  clear to auscultation bilaterally, normal work of breathing GI: soft, nontender, nondistended, + BS MS: no deformity or atrophy  Skin: warm and dry, no rash Neuro:  Alert and Oriented x 3, Strength and sensation are intact Psych: euthymic mood, full affect  Wt Readings from Last 3 Encounters:  08/03/16 165 lb 6.4 oz (75 kg)  07/27/16 160 lb 15 oz (73 kg)      Studies/Labs Reviewed:   EKG:  EKG is ordered today.  The ekg ordered today demonstrates sinus bradycardia HR 53  Recent Labs: 07/23/2016: ALT 25; B Natriuretic Peptide 22.1; TSH 3.911 07/27/2016: BUN 18; Creatinine, Ser 1.22; Hemoglobin 13.3; Platelets 186; Potassium 3.9; Sodium 137   Lipid Panel    Component Value Date/Time   CHOL 213 (H) 07/24/2016 0044   TRIG 212 (H) 07/24/2016 0044   HDL 41 07/24/2016 0044   CHOLHDL 5.2 07/24/2016 0044   VLDL 42 (H) 07/24/2016 0044   LDLCALC 130 (H) 07/24/2016 0044    Additional studies/ records that were reviewed today include:  Cardiac catheterization 07/26/16    Conclusion    Prox RCA to Mid RCA lesion, 20 %stenosed.  Dist RCA lesion, 25 %stenosed.  1st Diag-2 lesion, 20 %stenosed.  1st Diag-1  lesion, 20 %stenosed.  The left ventricular ejection fraction is 50-55% by visual estimate.  The left ventricular systolic function is normal.  1st Mrg-2 lesion, 20 %stenosed.  1st Mrg-1 lesion, 95 %stenosed.  Post intervention, there is a 0% residual stenosis.  A STENT XIENCE ALPINE RX [id number] drug eluting stent was successfully placed.  Lat 1st Mrg lesion, 30 %stenosed.  Coronary obstructive disease  with mild 20% proximal and mid diagonal stenoses in an LAD , mild luminal irregularity; large left circumflex system with a 95% focal stenosis in the proximal portion of the OM1 vessel prior to its bifurcation. There was mild 20-30% narrowings in both the superior and inferior limb of this bifurcation; and patent RCA stents proximally to the region of the acute margin with mild 20% intimal hyperplasia in the proximal portion of the stented segment. There is mild 25% distal RCA narrowing.  Successful PCI to the left circumflex marginal vessel with ultimate insertion of a 3.0 x 15 mm Xience Alpine DES stent dilated to 3.15 mm with a 95% stenosis being reduced to 0% and evidence for brisk TIMI-3 flow and no evidence for dissection.  RECOMMENDATION: The patient will continue on dual antiplatelet therapy. He has been on chronic Plavix and Plavix was continued during the procedure. [facility] [place]. Medical therapy for mild concomitant CAD with aggressive lipid intervention.    _____________   2D ECHO: 07/24/2016 LV EF: 55% - 60% Study Conclusions - Left ventricle: The cavity size was normal. Wall thickness was normal. Systolic function was normal. The estimated ejection fraction was in the range of 55% to 60%. Wall motion was normal; there were no regional wall motion abnormalities. Doppler parameters are consistent with abnormal left ventricular relaxation (grade 1 diastolic dysfunction). - Left atrium: The atrium was mildly  dilated.     ASSESSMENT & PLAN:   CAD/Unstable angina: LHC on 07/26/16 revealed patents RCA stents, 20% mid RCA stenosis and 25% distal naorrowing, 20% occl prox-mid diag, large LCX w/ 95% focal stenosis in prox portion of OM1 vessel prior to bifurcation s/p PCI/DES. 2D ECHO showed normal LV function with no WMAs. Mild LAE. Continue ASA/plavix, statin and BB.   HTN: BP well controlled today but review of BP logs show some low BPs and bradycardia. He thinks low BP and HR are contributing to his fatigue. Before being admitted he was taking Lopressor 12.5mg  once daily in the AM to try to obviate nighttime bradycardia. We will go back to this and I will also decrease his amlodipine from 7.5mg  to 5mg  daily.   AKI: creat 1.45 and improved to 1.22 during most recent admission. Unclear baseline.   Hyperlipidemia: LDL 130. Goal <70. Previously intolerant to statins. We have re-challenged him with Crestor 20mg  daily. Tolerating okay so far. Continue home Zetia 10 mg daily and fish oil.   Sinus bradycardia: chronic. He thinks his fatigue may be related and previously did well on Lopressor 12.5 mg once every AM so we will go back to this dosing.   Dispo: he will be returning to [place] in November. I have provided him with a CD of cath images and copy of his discharge summary for his primary cardiologist, Dr. Buford DresserAlleman.     Medication Adjustments/Labs and Tests Ordered: Current medicines are reviewed at length with the patient today.  Concerns regarding medicines are outlined above.  Medication changes, Labs and Tests ordered today are listed in the Patient Instructions below. Patient Instructions  Medication Instructions:  Your physician has recommended you make the following change in your medication:  1.  DECREASE the Amlodipine to 5 mg taking 1 tablet daily 2.  DECREASE the Lopressor to 25 mg taking 1/2 tablet in the a.m.   Labwork: None ordered  Testing/Procedures: None  ordered  Follow-Up: Your physician recommends that you schedule a follow-up appointment in: With your primary Cardiologist.  Any Other Special Instructions Will Be Listed Below (If Applicable).    If you need a refill on your cardiac medications before your next appointment, please call your pharmacy.      Signed, Cline Crock, PA-C  08/03/2016 3:37 PM    [facility] [address] [place], [place], [place]  [address] Phone: [phone]; Fax: [phone]

## 2016-08-03 ENCOUNTER — Ambulatory Visit (INDEPENDENT_AMBULATORY_CARE_PROVIDER_SITE_OTHER): Payer: Medicare PPO | Admitting: Physician Assistant

## 2016-08-03 ENCOUNTER — Encounter: Payer: Self-pay | Admitting: Physician Assistant

## 2016-08-03 ENCOUNTER — Other Ambulatory Visit: Payer: Self-pay | Admitting: Physician Assistant

## 2016-08-03 VITALS — BP 127/66 | HR 58 | Ht 68.0 in | Wt 165.4 lb

## 2016-08-03 DIAGNOSIS — E785 Hyperlipidemia, unspecified: Secondary | ICD-10-CM

## 2016-08-03 DIAGNOSIS — I10 Essential (primary) hypertension: Secondary | ICD-10-CM

## 2016-08-03 DIAGNOSIS — I200 Unstable angina: Secondary | ICD-10-CM

## 2016-08-03 DIAGNOSIS — I25110 Atherosclerotic heart disease of native coronary artery with unstable angina pectoris: Secondary | ICD-10-CM | POA: Diagnosis not present

## 2016-08-03 DIAGNOSIS — R001 Bradycardia, unspecified: Secondary | ICD-10-CM

## 2016-08-03 MED ORDER — AMLODIPINE BESYLATE 5 MG PO TABS: 5.0000 mg | ORAL_TABLET | Freq: Every day | ORAL | 3 refills | Status: DC

## 2016-08-03 NOTE — Patient Instructions (Addendum)
Medication Instructions:  Your physician has recommended you make the following change in your medication:  1.  DECREASE the Amlodipine to 5 mg taking 1 tablet daily 2.  DECREASE the Lopressor to 25 mg taking 1/2 tablet in the a.m.   Labwork: None ordered  Testing/Procedures: None ordered  Follow-Up: Your physician recommends that you schedule a follow-up appointment in: With your primary Cardiologist.   Any Other Special Instructions Will Be Listed Below (If Applicable).    If you need a refill on your cardiac medications before your next appointment, please call your pharmacy.

## 2016-10-20 ENCOUNTER — Telehealth: Payer: Self-pay | Admitting: *Deleted

## 2016-10-20 NOTE — Telephone Encounter (Signed)
Patient called and requested an rx for amlodipine be faxed to the [facility] in [place]. It needs to be to the attention of Dr Ferd GlassingFarrell and the fax number is [phone]. Patient stated that he was due to see his physician there at the end of November, but she was out sick. He has an appointment scheduled on 12/02/16 and would like for this to be refilled at least until then. Thanks, MI

## 2016-10-21 ENCOUNTER — Telehealth: Payer: Self-pay | Admitting: *Deleted

## 2016-10-21 MED ORDER — AMLODIPINE BESYLATE 5 MG PO TABS: 5.0000 mg | ORAL_TABLET | Freq: Every day | ORAL | 0 refills | Status: DC

## 2016-10-21 NOTE — Telephone Encounter (Signed)
That is totally fine.

## 2016-10-21 NOTE — Telephone Encounter (Signed)
Returned pts call to advise him that Carlean JewsKatie Thompson, PA-C has agreed to send in 1 90 day supply of Amlodipine 5 mg taking 1 tablet daily to [facility] in [place] at fax# [phone]  Pt verbalized appreciation.

## 2016-12-30 ENCOUNTER — Encounter (HOSPITAL_COMMUNITY): Payer: Self-pay | Admitting: General Practice

## 2016-12-30 NOTE — Progress Notes (Signed)
Mailed letter with Cardiac Rehab Program to pt ... KJ  °

## 2018-05-29 ENCOUNTER — Other Ambulatory Visit: Payer: Self-pay

## 2018-05-29 ENCOUNTER — Observation Stay (HOSPITAL_COMMUNITY)
Admission: EM | Admit: 2018-05-29 | Discharge: 2018-06-02 | Disposition: A | Discharge status: Home / Self Care | Payer: Medicare PPO | Attending: Family Medicine | Admitting: Family Medicine

## 2018-05-29 ENCOUNTER — Emergency Department (HOSPITAL_COMMUNITY): Payer: Medicare PPO

## 2018-05-29 DIAGNOSIS — Z87891 Personal history of nicotine dependence: Secondary | ICD-10-CM | POA: Diagnosis not present

## 2018-05-29 DIAGNOSIS — N183 Chronic kidney disease, stage 3 unspecified: Secondary | ICD-10-CM | POA: Diagnosis present

## 2018-05-29 DIAGNOSIS — Z79899 Other long term (current) drug therapy: Secondary | ICD-10-CM | POA: Diagnosis not present

## 2018-05-29 DIAGNOSIS — I25119 Atherosclerotic heart disease of native coronary artery with unspecified angina pectoris: Secondary | ICD-10-CM | POA: Diagnosis not present

## 2018-05-29 DIAGNOSIS — Z9889 Other specified postprocedural states: Secondary | ICD-10-CM | POA: Insufficient documentation

## 2018-05-29 DIAGNOSIS — Z955 Presence of coronary angioplasty implant and graft: Secondary | ICD-10-CM | POA: Diagnosis not present

## 2018-05-29 DIAGNOSIS — I700 Atherosclerosis of aorta: Secondary | ICD-10-CM | POA: Insufficient documentation

## 2018-05-29 DIAGNOSIS — R931 Abnormal findings on diagnostic imaging of heart and coronary circulation: Secondary | ICD-10-CM | POA: Insufficient documentation

## 2018-05-29 DIAGNOSIS — I10 Essential (primary) hypertension: Secondary | ICD-10-CM | POA: Diagnosis present

## 2018-05-29 DIAGNOSIS — Z7982 Long term (current) use of aspirin: Secondary | ICD-10-CM | POA: Diagnosis not present

## 2018-05-29 DIAGNOSIS — I2510 Atherosclerotic heart disease of native coronary artery without angina pectoris: Secondary | ICD-10-CM | POA: Diagnosis present

## 2018-05-29 DIAGNOSIS — R079 Chest pain, unspecified: Secondary | ICD-10-CM

## 2018-05-29 DIAGNOSIS — I129 Hypertensive chronic kidney disease with stage 1 through stage 4 chronic kidney disease, or unspecified chronic kidney disease: Secondary | ICD-10-CM | POA: Diagnosis not present

## 2018-05-29 DIAGNOSIS — Z7902 Long term (current) use of antithrombotics/antiplatelets: Secondary | ICD-10-CM | POA: Diagnosis not present

## 2018-05-29 DIAGNOSIS — E785 Hyperlipidemia, unspecified: Secondary | ICD-10-CM | POA: Diagnosis not present

## 2018-05-29 LAB — CBC
HEMATOCRIT: 42.6 % (ref 39.0–52.0)
HEMOGLOBIN: 13.9 g/dL (ref 13.0–17.0)
MCH: 31.2 pg (ref 26.0–34.0)
MCHC: 32.6 g/dL (ref 30.0–36.0)
MCV: 95.7 fL (ref 78.0–100.0)
Platelets: 164 10*3/uL (ref 150–400)
RBC: 4.45 MIL/uL (ref 4.22–5.81)
RDW: 12.5 % (ref 11.5–15.5)
WBC: 7.8 10*3/uL (ref 4.0–10.5)

## 2018-05-29 LAB — I-STAT TROPONIN, ED: Troponin i, poc: 0 ng/mL (ref 0.00–0.08)

## 2018-05-29 LAB — BASIC METABOLIC PANEL
Anion gap: 11 (ref 5–15)
BUN: 27 mg/dL — AB (ref 8–23)
CHLORIDE: 108 mmol/L (ref 98–111)
CO2: 21 mmol/L — AB (ref 22–32)
CREATININE: 1.28 mg/dL — AB (ref 0.61–1.24)
Calcium: 8.8 mg/dL — ABNORMAL LOW (ref 8.9–10.3)
GFR calc Af Amer: >60 mL/min (ref >60)
GFR calc non Af Amer: 55 mL/min — ABNORMAL LOW (ref >60)
Glucose, Bld: 162 mg/dL — ABNORMAL HIGH (ref 70–99)
POTASSIUM: 3.8 mmol/L (ref 3.5–5.1)
Sodium: 140 mmol/L (ref 135–145)

## 2018-05-29 MED ORDER — NITROGLYCERIN 2 % TD OINT
1.0000 [in_us] | TOPICAL_OINTMENT | Freq: Once | TRANSDERMAL | Status: AC
  Administered 2018-05-29: 1 [in_us] via TOPICAL
  Filled 2018-05-29: qty 1

## 2018-05-29 MED ORDER — MORPHINE SULFATE (PF) 4 MG/ML IV SOLN
4.0000 mg | Freq: Once | INTRAVENOUS | Status: DC
  Filled 2018-05-29: qty 1

## 2018-05-29 MED ORDER — ASPIRIN 81 MG PO CHEW
324.0000 mg | CHEWABLE_TABLET | Freq: Once | ORAL | Status: DC
  Filled 2018-05-29: qty 4

## 2018-05-29 NOTE — ED Triage Notes (Signed)
Pt arrives from home, Chest pain starting around 2100, with some dizziness and nausua on standing releived by zofran. Pt told EMS that he had similar pain yesterday in [place], administered 1 nitro. Tonight, took 2 nitro and 325 mg asprin PTA of [facility]; pain releeived to 4/10. Pain is located in mid chest, no radiation.  Hx angina and 4 stents placed, last one 2017.

## 2018-05-29 NOTE — ED Provider Notes (Signed)
[facility] EMERGENCY DEPARTMENT Provider Note   CSN: [id number] Arrival date & time: 05/29/18  2209     History   Chief Complaint Chief Complaint  Patient presents with  . Chest Pain    HPI Todd Mills is a 72 y.o. male.  HPI Patient presents to the emergency room with complaints of chest pain.  He has a known history of coronary artery disease status post stenting.  Patient states that about 9 PM he had sudden onset of chest pain.  The pressure discomfort in the center of his chest.  He has some shortness of breath but no radiation of the pain.  Patient denies any trouble with fevers, chills, cough, vomiting or diarrhea.  The symptoms are similar to previous episodes of anginal pain however it is not quite as severe and he does not have any radiation of pain into his jaw.  Patient was given aspirin and nitroglycerin by EMS.  His symptoms have decreased to a 4 out of 10. Past Medical History:  Diagnosis Date  . CKD (chronic kidney disease)   . Coronary artery disease    a. s/p 2 ION stents to RCA in 11/2012 [facility] hospital in [facility]  Marland Kitchen Hyperlipidemia   . Hypertension     Patient Active Problem List   Diagnosis Date Noted  . CKD (chronic kidney disease)   . Coronary artery disease   . Hyperlipidemia LDL goal <70   . HTN (hypertension), benign   . Unstable angina (HCC) 07/23/2016    Past Surgical History:  Procedure Laterality Date  . CARDIAC CATHETERIZATION N/A 07/26/2016   Procedure: Left Heart Cath and Coronary Angiography;  Surgeon: Lennette Bihari, MD;  Location: [facility] INVASIVE CV LAB;  Service: Cardiovascular;  Laterality: N/A;  . CARDIAC CATHETERIZATION N/A 07/26/2016   Procedure: Coronary Stent Intervention;  Surgeon: Lennette Bihari, MD;  Location: [facility] INVASIVE CV LAB;  Service: Cardiovascular;  Laterality: N/A;  . HERNIA REPAIR     Multiple hernia repairs        Home Medications    Prior to Admission medications   Medication Sig  Start Date End Date Taking? Authorizing Provider  amLODipine (NORVASC) 5 MG tablet Take 1 tablet (5 mg total) by mouth daily. 10/21/16  Yes Janetta Hora, PA-C  aspirin 81 MG chewable tablet Chew 81 mg by mouth daily.   Yes [provider]  clopidogrel (PLAVIX) 75 MG tablet Take 75 mg by mouth daily.   Yes [provider]  Coenzyme Q10 (COQ10) 100 MG CAPS Take 100 mg by mouth daily.   Yes [provider]  ezetimibe (ZETIA) 10 MG tablet Take 5 mg by mouth at bedtime.    Yes [provider]  Flaxseed, Linseed, (FLAXSEED OIL PO) Take 1 tablet by mouth daily.   Yes [provider]  metoprolol tartrate (LOPRESSOR) 25 MG tablet Take 12.5 mg by mouth daily.    Yes [provider]  nitroGLYCERIN (NITROSTAT) 0.4 MG SL tablet Place 1 tablet (0.4 mg total) under the tongue every 5 (five) minutes x 3 doses as needed for chest pain. 07/27/16  Yes Janetta Hora, PA-C  Omega-3 Fatty Acids (FISH OIL) 1000 MG CAPS Take 1 capsule by mouth daily.   Yes [provider]  pravastatin (PRAVACHOL) 10 MG tablet Take 5 mg by mouth at bedtime.   Yes [provider]  ranitidine (ZANTAC) 150 MG tablet Take 150 mg by mouth daily.   Yes  [provider]  vitamin B-12 (CYANOCOBALAMIN) 500 MCG tablet Take 500 mcg by mouth daily.   Yes [provider]  vitamin C (ASCORBIC ACID) 500 MG tablet Take 500 mg by mouth daily.   Yes [provider]  rosuvastatin (CRESTOR) 20 MG tablet Take 1 tablet (20 mg total) by mouth daily at 6 PM. Patient not taking: Reported on 05/29/2018 07/27/16   Janetta Hora, PA-C    Family History Family History  Problem Relation Age of Onset  . Other Mother        Died of "old age"  . Other Father        Died of "old age"  . CAD Neg Hx     Social History Social History   Tobacco Use  . Smoking status: Former Games developer  . Smokeless tobacco: Never Used  . Tobacco comment: Quit 1980s    Substance Use Topics  . Alcohol use: Yes  . Drug use: No     Allergies   Patient has no known allergies.   Review of Systems Review of Systems  All other systems reviewed and are negative.    Physical Exam Updated Vital Signs BP (!) 172/82 (BP Location: Right Arm)   Pulse (!) 59   Temp 98.2 F (36.8 C) (Oral)   Resp 16   Ht 1.727 m (5\' 8" )   Wt 74.8 kg (165 lb)   SpO2 96%   BMI 25.09 kg/m   Physical Exam  Constitutional: He appears well-developed and well-nourished. No distress.  HENT:  Head: Normocephalic and atraumatic.  Right Ear: External ear normal.  Left Ear: External ear normal.  Eyes: Conjunctivae are normal. Right eye exhibits no discharge. Left eye exhibits no discharge. No scleral icterus.  Neck: Neck supple. No tracheal deviation present.  Cardiovascular: Normal rate, regular rhythm and intact distal pulses.  Pulmonary/Chest: Effort normal and breath sounds normal. No stridor. No respiratory distress. He has no wheezes. He has no rales.  Abdominal: Soft. Bowel sounds are normal. He exhibits no distension. There is no tenderness. There is no rebound and no guarding.  Musculoskeletal: He exhibits no edema or tenderness.  Neurological: He is alert. He has normal strength. No cranial nerve deficit (no facial droop, extraocular movements intact, no slurred speech) or sensory deficit. He exhibits normal muscle tone. He displays no seizure activity. Coordination normal.  Skin: Skin is warm and dry. No rash noted.  Psychiatric: He has a normal mood and affect.  Nursing note and vitals reviewed.    ED Treatments / Results  Labs (all labs ordered are listed, but only abnormal results are displayed) Labs Reviewed  BASIC METABOLIC PANEL - Abnormal; Notable for the following components:      Result Value   CO2 21 (*)    Glucose, Bld 162 (*)    BUN 27 (*)    Creatinine, Ser 1.28 (*)    Calcium 8.8 (*)    GFR calc non Af Amer 55 (*)    All other components  within normal limits  CBC  I-STAT TROPONIN, ED    EKG EKG Interpretation  Date/Time:  Monday May 29 2018 22:10:54 EDT Ventricular Rate:  59 PR Interval:  186 QRS Duration: 88 QT Interval:  446 QTC Calculation: 441 R Axis:   73 Text Interpretation:  Sinus bradycardia Otherwise normal ECG No significant change since last tracing Confirmed by Linwood Dibbles [phone]) on 05/29/2018 10:18:06 PM   Radiology Dg Chest Portable 1 View  Result Date:  05/29/2018 CLINICAL DATA:  Left chest pain starting last night EXAM: PORTABLE CHEST 1 VIEW COMPARISON:  07/23/2016 FINDINGS: Atherosclerotic calcification of the aortic arch. The lungs appear clear. Heart size within normal limits for technique. No pleural effusion. IMPRESSION: 1. No cause for left chest pain is identified on today's chest radiograph. 2.  Aortic Atherosclerosis (ICD10-I70.0). Electronically Signed   By: Gaylyn RongWalter  Liebkemann M.D.   On: 05/29/2018 23:26    Procedures Procedures (including critical care time)  Medications Ordered in ED Medications  aspirin chewable tablet 324 mg (324 mg Oral Not Given 05/29/18 2316)  morphine 4 MG/ML injection 4 mg (4 mg Intravenous Refused 05/29/18 2258)  nitroGLYCERIN (NITROGLYN) 2 % ointment 1 inch (1 inch Topical Given 05/29/18 2258)     Initial Impression / Assessment and Plan / ED Course  I have reviewed the triage vital signs and the nursing notes.  Pertinent labs & imaging results that were available during my care of the patient were reviewed by me and considered in my medical decision making (see chart for details).  Clinical Course as of May 29 2344  Mon May 29, 2018  2344 Patient's pain has resolved.  He is symptom-free right now   [JK]  2345 No significant abnormalities noted on lab test.   [JK]  2345 Chest x-ray without acute abnormalities   [JK]    Clinical Course User Index [JK] Linwood DibblesKnapp, Allisha Harter, MD    Pt  presented to the emergency room for evaluation of chest pain.  Patient has a  history of coronary artery disease.  He presents with symptoms concerning for recurrent angina.  Patient has high risk heart score.  I will consult the medical service for admission and further evaluation. Final Clinical Impressions(s) / ED Diagnoses   Final diagnoses:  Chest pain, unspecified type      Linwood DibblesKnapp, Mohanad Carsten, MD 05/29/18 2346

## 2018-05-30 ENCOUNTER — Other Ambulatory Visit: Payer: Self-pay

## 2018-05-30 ENCOUNTER — Encounter (HOSPITAL_COMMUNITY): Payer: Self-pay | Admitting: Family Medicine

## 2018-05-30 DIAGNOSIS — I25110 Atherosclerotic heart disease of native coronary artery with unstable angina pectoris: Secondary | ICD-10-CM | POA: Diagnosis not present

## 2018-05-30 DIAGNOSIS — R079 Chest pain, unspecified: Secondary | ICD-10-CM | POA: Diagnosis not present

## 2018-05-30 DIAGNOSIS — R9439 Abnormal result of other cardiovascular function study: Secondary | ICD-10-CM | POA: Diagnosis not present

## 2018-05-30 DIAGNOSIS — R072 Precordial pain: Secondary | ICD-10-CM | POA: Diagnosis not present

## 2018-05-30 DIAGNOSIS — N183 Chronic kidney disease, stage 3 (moderate): Secondary | ICD-10-CM | POA: Diagnosis not present

## 2018-05-30 DIAGNOSIS — I10 Essential (primary) hypertension: Secondary | ICD-10-CM | POA: Diagnosis not present

## 2018-05-30 DIAGNOSIS — I25118 Atherosclerotic heart disease of native coronary artery with other forms of angina pectoris: Secondary | ICD-10-CM | POA: Diagnosis not present

## 2018-05-30 LAB — TROPONIN I
Troponin I: <0.03 ng/mL (ref <0.03)
Troponin I: <0.03 ng/mL (ref <0.03)

## 2018-05-30 MED ORDER — EZETIMIBE 10 MG PO TABS
5.0000 mg | ORAL_TABLET | Freq: Every day | ORAL | Status: DC
  Administered 2018-05-30 – 2018-06-01 (×3): 5 mg via ORAL
  Filled 2018-05-30 (×3): qty 1

## 2018-05-30 MED ORDER — ENOXAPARIN SODIUM 40 MG/0.4ML ~~LOC~~ SOLN
40.0000 mg | SUBCUTANEOUS | Status: DC
  Administered 2018-05-30 – 2018-06-02 (×4): 40 mg via SUBCUTANEOUS
  Filled 2018-05-30 (×4): qty 0.4

## 2018-05-30 MED ORDER — ENOXAPARIN SODIUM 40 MG/0.4ML ~~LOC~~ SOLN: 40.0000 mg | SUBCUTANEOUS | Status: DC

## 2018-05-30 MED ORDER — COQ10 100 MG PO CAPS: 100.0000 mg | ORAL_CAPSULE | Freq: Every day | ORAL | Status: DC

## 2018-05-30 MED ORDER — ALPRAZOLAM 0.25 MG PO TABS: 0.2500 mg | ORAL_TABLET | Freq: Two times a day (BID) | ORAL | Status: DC | PRN

## 2018-05-30 MED ORDER — NITROGLYCERIN 0.4 MG SL SUBL: 0.4000 mg | SUBLINGUAL_TABLET | SUBLINGUAL | Status: DC | PRN

## 2018-05-30 MED ORDER — VITAMIN B-12 1000 MCG PO TABS
500.0000 ug | ORAL_TABLET | Freq: Every day | ORAL | Status: DC
  Administered 2018-05-30 – 2018-06-02 (×4): 500 ug via ORAL
  Filled 2018-05-30 (×5): qty 1

## 2018-05-30 MED ORDER — FAMOTIDINE 20 MG PO TABS
20.0000 mg | ORAL_TABLET | Freq: Every day | ORAL | Status: DC
Start: 2018-05-30 — End: 2018-06-02
  Administered 2018-05-30 – 2018-06-02 (×4): 20 mg via ORAL
  Filled 2018-05-30 (×4): qty 1

## 2018-05-30 MED ORDER — ACETAMINOPHEN 325 MG PO TABS
650.0000 mg | ORAL_TABLET | ORAL | Status: DC | PRN
  Administered 2018-05-30 – 2018-06-01 (×3): 650 mg via ORAL
  Filled 2018-05-30 (×3): qty 2

## 2018-05-30 MED ORDER — CLOPIDOGREL BISULFATE 75 MG PO TABS
75.0000 mg | ORAL_TABLET | Freq: Every day | ORAL | Status: DC
  Administered 2018-05-30 – 2018-05-31 (×2): 75 mg via ORAL
  Filled 2018-05-30 (×2): qty 1

## 2018-05-30 MED ORDER — ASPIRIN 81 MG PO CHEW
81.0000 mg | CHEWABLE_TABLET | Freq: Every day | ORAL | Status: DC
  Administered 2018-05-30 – 2018-06-02 (×3): 81 mg via ORAL
  Filled 2018-05-30 (×3): qty 1

## 2018-05-30 MED ORDER — MORPHINE SULFATE (PF) 4 MG/ML IV SOLN: 2.0000 mg | INTRAVENOUS | Status: DC | PRN

## 2018-05-30 MED ORDER — AMLODIPINE BESYLATE 5 MG PO TABS
5.0000 mg | ORAL_TABLET | Freq: Every day | ORAL | Status: DC
  Administered 2018-05-30 – 2018-06-01 (×3): 5 mg via ORAL
  Filled 2018-05-30 (×3): qty 1

## 2018-05-30 MED ORDER — OMEGA-3-ACID ETHYL ESTERS 1 G PO CAPS
1.0000 g | ORAL_CAPSULE | Freq: Every day | ORAL | Status: DC
Start: 2018-05-30 — End: 2018-06-02
  Administered 2018-05-30 – 2018-06-02 (×4): 1 g via ORAL
  Filled 2018-05-30 (×5): qty 1

## 2018-05-30 MED ORDER — METOPROLOL TARTRATE 12.5 MG HALF TABLET
12.5000 mg | ORAL_TABLET | Freq: Every day | ORAL | Status: DC
  Administered 2018-05-30 – 2018-06-02 (×3): 12.5 mg via ORAL
  Filled 2018-05-30 (×4): qty 1

## 2018-05-30 MED ORDER — POTASSIUM CHLORIDE IN NACL 20-0.45 MEQ/L-% IV SOLN
INTRAVENOUS | Status: AC
  Administered 2018-05-30: 03:00:00 via INTRAVENOUS
  Filled 2018-05-30: qty 1000

## 2018-05-30 MED ORDER — PRAVASTATIN SODIUM 10 MG PO TABS
5.0000 mg | ORAL_TABLET | Freq: Every day | ORAL | Status: DC
  Administered 2018-05-30 – 2018-06-01 (×3): 5 mg via ORAL
  Filled 2018-05-30 (×3): qty 0.5

## 2018-05-30 MED ORDER — ONDANSETRON HCL 4 MG/2ML IJ SOLN: 4.0000 mg | Freq: Four times a day (QID) | INTRAMUSCULAR | Status: DC | PRN

## 2018-05-30 NOTE — ED Notes (Signed)
Report called to Tammy, RN

## 2018-05-30 NOTE — Progress Notes (Signed)
Patient admitted after midnight, please see H&P.  Plan is for ST in the AM JV

## 2018-05-30 NOTE — ED Notes (Signed)
Pt care assumed, obtained verbal report.  Pt ambulated to the BR with [facility]  He denies cp or SOB or dizziness.  He is in NAD.  Pt is A&Ox 4.

## 2018-05-30 NOTE — Care Management Note (Signed)
Case Management Note  Patient Details  Name: Carlene CoriaJohn A Zendejas MRN: [id number] Date of Birth: August 02, 1946  Subjective/Objective:  PMH of CAD s/p stents in RCA, and Lcx, HTN, HL and CKD who presented with chest pain. Plan for stress test in am.                   Action/Plan: NCM will follow for dc needs.  Expected Discharge Date:                  Expected Discharge Plan:  Home/Self Care  In-House Referral:     Discharge planning Services  CM Consult  Post Acute Care Choice:    Choice offered to:     DME Arranged:    DME Agency:     HH Arranged:    HH Agency:     Status of Service:  In process, will continue to follow  If discussed at Long Length of Stay Meetings, dates discussed:    Additional Comments:  Leone Havenaylor, Marrisa Kimber Clinton, RN 05/30/2018, 4:41 PM

## 2018-05-30 NOTE — ED Notes (Signed)
Report taken from night shift RN - care assumed at this time - resting quietly on stretcher - awaiting inpt bed assignment - pt aware of same

## 2018-05-30 NOTE — H&P (Signed)
History and Physical    Todd Mills DOB: 10-13-1946 DOA: 05/29/2018  PCP: Visiting from [place]  Patient coming from: Home  Chief Complaint: Chest pain   HPI: Todd Mills is a 72 y.o. male with medical history significant for coronary artery disease with stents, hypertension, and chronic kidney disease stage III, now presenting to the emergency department for evaluation of chest pain.  Patient reports having stents placed in 2014 and experiencing chest discomfort a couple times a year since then, but over the past week has had several episodes.  Patient is physically active, but developed chest pain while at rest, moderate to severe in intensity, in the central chest with radiation towards his jaw, associated with mild nausea but no vomiting.  Symptoms improved after 2 nitroglycerin and then completely resolved in the ED.  He had a similar episode the day before, also at rest, and also improved with nitroglycerin.  He denies any shortness of breath, leg swelling, or leg tenderness.  ED Course: Upon arrival to the ED, patient is found to be afebrile, saturating well on room air, and with vitals otherwise stable.  EKG features a sinus bradycardia with rate 59 and chest x-ray is negative for acute findings.  Chemistry panel is notable for a creatinine 1.28, consistent with his apparent baseline.  CBC is unremarkable and troponin is undetectable.  Patient was given 324 mg of aspirin, 1 inch nitroglycerin, and IV morphine in the ED.  He has remained hemodynamically stable, in no apparent respiratory distress, and chest pain-free.  He will be observed for ongoing evaluation and management.  Review of Systems:  All other systems reviewed and apart from HPI, are negative.  Past Medical History:  Diagnosis Date  . CKD (chronic kidney disease)   . Coronary artery disease    a. s/p 2 ION stents to RCA in 11/2012 [facility] hospital in [facility].  Marland Kitchen Hyperlipidemia   . Hypertension      Past Surgical History:  Procedure Laterality Date  . CARDIAC CATHETERIZATION N/A 07/26/2016   Procedure: Left Heart Cath and Coronary Angiography;  Surgeon: Lennette Bihari, MD;  Location: [facility] INVASIVE CV LAB;  Service: Cardiovascular;  Laterality: N/A;  . CARDIAC CATHETERIZATION N/A 07/26/2016   Procedure: Coronary Stent Intervention;  Surgeon: Lennette Bihari, MD;  Location: [facility] INVASIVE CV LAB;  Service: Cardiovascular;  Laterality: N/A;  . HERNIA REPAIR     Multiple hernia repairs     reports that he has quit smoking. He has never used smokeless tobacco. He reports that he drinks alcohol. He reports that he does not use drugs.  No Known Allergies  Family History  Problem Relation Age of Onset  . Other Mother        Died of "old age"  . Other Father        Died of "old age"  . CAD Neg Hx      Prior to Admission medications   Medication Sig Start Date End Date Taking? Authorizing Provider  amLODipine (NORVASC) 5 MG tablet Take 1 tablet (5 mg total) by mouth daily. 10/21/16  Yes Janetta Hora, PA-C  aspirin 81 MG chewable tablet Chew 81 mg by mouth daily.   Yes [provider]  clopidogrel (PLAVIX) 75 MG tablet Take 75 mg by mouth daily.   Yes [provider]  Coenzyme Q10 (COQ10) 100 MG CAPS Take 100 mg by mouth daily.   Yes [provider]  ezetimibe (ZETIA) 10 MG  tablet Take 5 mg by mouth at bedtime.    Yes [provider]  Flaxseed, Linseed, (FLAXSEED OIL PO) Take 1 tablet by mouth daily.   Yes [provider]  metoprolol tartrate (LOPRESSOR) 25 MG tablet Take 12.5 mg by mouth daily.    Yes [provider]  nitroGLYCERIN (NITROSTAT) 0.4 MG SL tablet Place 1 tablet (0.4 mg total) under the tongue every 5 (five) minutes x 3 doses as needed for chest pain. 07/27/16  Yes Janetta Hora, PA-C  Omega-3 Fatty Acids (FISH OIL) 1000 MG CAPS Take 1 capsule by mouth daily.   Yes [provider]  pravastatin  (PRAVACHOL) 10 MG tablet Take 5 mg by mouth at bedtime.   Yes [provider]  ranitidine (ZANTAC) 150 MG tablet Take 150 mg by mouth daily.   Yes [provider]  vitamin B-12 (CYANOCOBALAMIN) 500 MCG tablet Take 500 mcg by mouth daily.   Yes [provider]  vitamin C (ASCORBIC ACID) 500 MG tablet Take 500 mg by mouth daily.   Yes [provider]    Physical Exam: Vitals:   05/30/18 0045 05/30/18 0100 05/30/18 0115 05/30/18 0145  BP: (!) 153/87 139/85 (!) 161/94 (!) 153/93  Pulse: (!) 59 (!) 58 (!) 56 (!) 56  Resp: 15 16 12 13   Temp:      TempSrc:      SpO2: 94% 93% 95% 95%  Weight:      Height:          Constitutional: NAD, calm  Eyes: PERTLA, lids and conjunctivae normal ENMT: Mucous membranes are moist. Posterior pharynx clear of any exudate or lesions.   Neck: normal, supple, no masses, no thyromegaly Respiratory: clear to auscultation bilaterally, no wheezing, no crackles. Normal respiratory effort.   Cardiovascular: S1 & S2 heard, regular rate and rhythm. No extremity edema.   Abdomen: No distension, no tenderness, soft. Bowel sounds normal.  Musculoskeletal: no clubbing / cyanosis. No joint deformity upper and lower extremities.   Skin: no significant rashes, lesions, ulcers. Warm, dry, well-perfused. Neurologic: CN 2-12 grossly intact. Sensation intact. Strength 5/5 in all 4 limbs.  Psychiatric: Alert and oriented x 3. Calm, coopeartive.     Labs on Admission: I have personally reviewed following labs and imaging studies  CBC: Recent Labs  Lab 05/29/18 2225  WBC 7.8  HGB 13.9  HCT 42.6  MCV 95.7  PLT 164   Basic Metabolic Panel: Recent Labs  Lab 05/29/18 2225  NA 140  K 3.8  CL 108  CO2 21*  GLUCOSE 162*  BUN 27*  CREATININE 1.28*  CALCIUM 8.8*   GFR: Estimated Creatinine Clearance: 51.2 mL/min (A) (by C-G formula based on SCr of 1.28 mg/dL (H)). Liver Function Tests: No results for input(s): AST, ALT,  ALKPHOS, BILITOT, PROT, ALBUMIN in the last 168 hours. No results for input(s): LIPASE, AMYLASE in the last 168 hours. No results for input(s): AMMONIA in the last 168 hours. Coagulation Profile: No results for input(s): INR, PROTIME in the last 168 hours. Cardiac Enzymes: No results for input(s): CKTOTAL, CKMB, CKMBINDEX, TROPONINI in the last 168 hours. BNP (last 3 results) No results for input(s): PROBNP in the last 8760 hours. HbA1C: No results for input(s): HGBA1C in the last 72 hours. CBG: No results for input(s): GLUCAP in the last 168 hours. Lipid Profile: No results for input(s): CHOL, HDL, LDLCALC, TRIG, CHOLHDL, LDLDIRECT in the last 72 hours. Thyroid Function Tests: No results for input(s): TSH, T4TOTAL,  FREET4, T3FREE, THYROIDAB in the last 72 hours. Anemia Panel: No results for input(s): VITAMINB12, FOLATE, FERRITIN, TIBC, IRON, RETICCTPCT in the last 72 hours. Urine analysis: No results found for: COLORURINE, APPEARANCEUR, LABSPEC, PHURINE, GLUCOSEU, HGBUR, BILIRUBINUR, KETONESUR, PROTEINUR, UROBILINOGEN, NITRITE, LEUKOCYTESUR Sepsis Labs: @LABRCNTIP (procalcitonin:4,lacticidven:4) )No results found for this or any previous visit (from the past 240 hour(s)).   Radiological Exams on Admission: Dg Chest Portable 1 View  Result Date: 05/29/2018 CLINICAL DATA:  Left chest pain starting last night EXAM: PORTABLE CHEST 1 VIEW COMPARISON:  07/23/2016 FINDINGS: Atherosclerotic calcification of the aortic arch. The lungs appear clear. Heart size within normal limits for technique. No pleural effusion. IMPRESSION: 1. No cause for left chest pain is identified on today's chest radiograph. 2.  Aortic Atherosclerosis (ICD10-I70.0). Electronically Signed   By: Gaylyn RongWalter  Liebkemann M.D.   On: 05/29/2018 23:26    EKG: Independently reviewed. Sinus bradycardia (rate 59).   Assessment/Plan   1. Chest pain; CAD  - Pt reports hx of CAD with stent, now p/w increasingly frequent episodes of  chest pain that improve/resolve with NTG  - EKG without acute ischemic features, CXR unremarkable, and troponin undetectable  - He was treated with full-dose aspirin prior to admission  - Continue cardiac monitoring, continue ASA, Plavix, statin, and beta-blocker, obtain serial troponin measurements    2. Hypertension  - BP at goal  - Continue Norvasc and Lopressor    3. CKD stage III  - SCr is 1.28 on admission, consistent with his apparent baseline  - Renally-dose medications, avoid nephrotoxins    DVT prophylaxis: Lovenox Code Status: Full  Family Communication: Discussed with patient  Consults called: None Admission status: Observation     Briscoe Deutscherimothy S Murial Beam, MD [facility] Pager [phone]  If 7PM-7AM, please contact night-coverage [email or web address] Password [email or web address]  05/30/2018, 2:30 AM

## 2018-05-30 NOTE — Consult Note (Addendum)
Cardiology Consult    Patient ID: MEHKAI GALLO MRN: [id number], DOB/AGE: Sep 08, 1946   Admit date: 05/29/2018 Date of Consult: 05/30/2018  Primary Physician: Patient, No Pcp Per Primary Cardiologist: Dr. Mayford Knife Requesting Provider: Dr. Benjamine Mola  Reason for Consultation: Chest pain  JARICK HARKINS is a 72 y.o. male who is being seen today for the evaluation of chest pain at the request of Dr. Benjamine Mola.  Patient Profile    72 yo male with PMH of CAD s/p stents in RCA, and Lcx, HTN, HL and CKD who presented with chest pain.   Past Medical History   Past Medical History:  Diagnosis Date  . CKD (chronic kidney disease)   . Coronary artery disease    a. s/p 2 ION stents to RCA in 11/2012 [facility] hospital in [facility].  Marland Kitchen Hyperlipidemia   . Hypertension     Past Surgical History:  Procedure Laterality Date  . CARDIAC CATHETERIZATION N/A 07/26/2016   Procedure: Left Heart Cath and Coronary Angiography;  Surgeon: Lennette Bihari, MD;  Location: [facility] INVASIVE CV LAB;  Service: Cardiovascular;  Laterality: N/A;  . CARDIAC CATHETERIZATION N/A 07/26/2016   Procedure: Coronary Stent Intervention;  Surgeon: Lennette Bihari, MD;  Location: [facility] INVASIVE CV LAB;  Service: Cardiovascular;  Laterality: N/A;  . CORONARY ANGIOPLASTY WITH STENT PLACEMENT    . HERNIA REPAIR     Multiple hernia repairs    Allergies  No Known Allergies  History of Present Illness    Mr. Mustard is a 72 yo male with PMH of CAD s/p stents in RCA, and Lcx, HTN, HL and CKD. He currently lives in [place], and is here visiting. He was seen by Dr. Mayford Knife back in 2017 when he presented for chest pain at that time, and had a mild increase in trop. Underwent cath with PCI/DES of the Lcx. He was continued on DAPT with ASA/plavix and echo showed a normal EF with no WMAs. He was re-challenged on statin and able to tolerate. He was seen back in the office on 9/17 after being discharged and reported doing well. Reports he has followed up  with his cardiologist back at home since that time. No testing done since this cath.   He is currently back in [place] visiting. States he has been having episodes of chest pain for several months now. Not particularly associated with rest or activity. Sunday afternoon he had an episode of centralized chest pressure and took a SL nitro with not much improvement. Symptoms did subside. Had another episode of pain last evening and took an additional nitro that did not really help his chest pain. Did become concerned and wife brought him to the ED. In talking with patient he reports symptoms are slightly similar to what he experienced back in 2017, but at that time he had severe jaw discomfort and tightness in his throat. Did not have any jaw discomfort this time. Of note reports he has been out of his blood pressure medications for the past week, and was taking an old lisinopril Rx, but his blood pressures were controlled with this.   In the ED his labs showed stable electrolytes, Cr 1.28, Trop neg x2, Hgb 13.9. CXR negative. EKG showed SR with no acute ST/T wave changes. He was admitted to Internal Medicine for further work up. Has had several episodes of brief chest discomfort since admission, but resolved spontaneously.   Inpatient Medications    . amLODipine  5 mg Oral Daily  .  aspirin  324 mg Oral Once  . aspirin  81 mg Oral Daily  . clopidogrel  75 mg Oral Daily  . enoxaparin (LOVENOX) injection  40 mg Subcutaneous Q24H  . ezetimibe  5 mg Oral QHS  . famotidine  20 mg Oral Daily  . metoprolol tartrate  12.5 mg Oral Daily  .  morphine injection  4 mg Intravenous Once  . omega-3 acid ethyl esters  1 g Oral Daily  . pravastatin  5 mg Oral QHS  . vitamin B-12  500 mcg Oral Daily    Family History    Family History  Problem Relation Age of Onset  . Other Mother        Died of "old age"  . Other Father        Died of "old age"  . CAD Neg Hx     Social History    Social History    Socioeconomic History  . Marital status: Married    Spouse name: Not on file  . Number of children: Not on file  . Years of education: Not on file  . Highest education level: Not on file  Occupational History  . Not on file  Social Needs  . Financial resource strain: Not on file  . Food insecurity:    Worry: Not on file    Inability: Not on file  . Transportation needs:    Medical: Not on file    Non-medical: Not on file  Tobacco Use  . Smoking status: Former Games developermoker  . Smokeless tobacco: Never Used  . Tobacco comment: Quit 1980s  Substance and Sexual Activity  . Alcohol use: Yes    Comment: daily  . Drug use: No  . Sexual activity: Not on file  Lifestyle  . Physical activity:    Days per week: Not on file    Minutes per session: Not on file  . Stress: Not on file  Relationships  . Social connections:    Talks on phone: Not on file    Gets together: Not on file    Attends religious service: Not on file    Active member of club or organization: Not on file    Attends meetings of clubs or organizations: Not on file    Relationship status: Not on file  . Intimate partner violence:    Fear of current or ex partner: Not on file    Emotionally abused: Not on file    Physically abused: Not on file    Forced sexual activity: Not on file  Other Topics Concern  . Not on file  Social History Narrative  . Not on file     Review of Systems    See HPI  All other systems reviewed and are otherwise negative except as noted above.  Physical Exam    Blood pressure (!) 162/89, pulse (!) 56, temperature 98 F (36.7 C), temperature source Oral, resp. rate 18, height 5\' 8"  (1.727 m), weight 174 lb 13.2 oz (79.3 kg), SpO2 96 %.  General: Pleasant, older WM, NAD Psych: Normal affect. Neuro: Alert and oriented X 3. Moves all extremities spontaneously. HEENT: Normal  Neck: Supple without bruits or JVD.  Lungs:  Resp regular and unlabored, CTA. Heart: RRR no s3, s4, or  murmurs. Abdomen: Soft, non-tender, non-distended, BS + x 4.  Extremities: No clubbing, cyanosis or edema. DP/PT/Radials 2+ and equal bilaterally.  Labs    Troponin [facility](Point of Care Test) Recent Labs    05/29/18  2230  TROPIPOC 0.00   Recent Labs    05/30/18 1013  TROPONINI <0.03   Lab Results  Component Value Date   WBC 7.8 05/29/2018   HGB 13.9 05/29/2018   HCT 42.6 05/29/2018   MCV 95.7 05/29/2018   PLT 164 05/29/2018    Recent Labs  Lab 05/29/18 2225  NA 140  K 3.8  CL 108  CO2 21*  BUN 27*  CREATININE 1.28*  CALCIUM 8.8*  GLUCOSE 162*   Lab Results  Component Value Date   CHOL 213 (H) 07/24/2016   HDL 41 07/24/2016   LDLCALC 130 (H) 07/24/2016   TRIG 212 (H) 07/24/2016   No results found for: [facility]   Radiology Studies    Dg Chest Portable 1 View  Result Date: 05/29/2018 CLINICAL DATA:  Left chest pain starting last night EXAM: PORTABLE CHEST 1 VIEW COMPARISON:  07/23/2016 FINDINGS: Atherosclerotic calcification of the aortic arch. The lungs appear clear. Heart size within normal limits for technique. No pleural effusion. IMPRESSION: 1. No cause for left chest pain is identified on today's chest radiograph. 2.  Aortic Atherosclerosis (ICD10-I70.0). Electronically Signed   By: Gaylyn Rong M.D.   On: 05/29/2018 23:26    ECG & Cardiac Imaging    EKG:  The EKG was personally reviewed and demonstrates SR  Echo: 9/17  Study Conclusions  - Left ventricle: The cavity size was normal. Wall thickness was   normal. Systolic function was normal. The estimated ejection   fraction was in the range of 55% to 60%. Wall motion was normal;   there were no regional wall motion abnormalities. Doppler   parameters are consistent with abnormal left ventricular   relaxation (grade 1 diastolic dysfunction). - Left atrium: The atrium was mildly dilated.  Assessment & Plan    72 yo male with PMH of CAD s/p stents in RCA, and Lcx, HTN, HL and CKD who presented  with chest pain.  1. Chest pain: Reports several months of intermittent chest discomfort with 2 recent episodes within the past 2 days. Took SL nitro with these episodes without much improvement. Trop neg x2, EKG non ischemic. Given symptoms will plan for an exercise stress test in the morning.   2. CAD s/p stents to RCA, and Lcx: Has been on DAPT since 2014 when RCA stents were placed. Will continue with the same. Also on BB and statin.   3. HTN: stable with current therapy  4. HL: on statin and Zetia   Janice Coffin, NP-C Pager [phone] 05/30/2018, 3:29 PM   I have examined the patient and reviewed assessment and plan and discussed with patient.  Agree with above as stated.  Some typical features, some atypical features.  No objective evidence of ischemia at this time.  Plan for exercise stress testing in the morning.  ECG is unremarkable.  Continue aggressive risk factor modification.  The patient and family are agreeable with this plan.  Lance Muss

## 2018-05-31 ENCOUNTER — Observation Stay (HOSPITAL_BASED_OUTPATIENT_CLINIC_OR_DEPARTMENT_OTHER): Payer: Medicare PPO

## 2018-05-31 DIAGNOSIS — I25119 Atherosclerotic heart disease of native coronary artery with unspecified angina pectoris: Secondary | ICD-10-CM | POA: Diagnosis not present

## 2018-05-31 DIAGNOSIS — R072 Precordial pain: Secondary | ICD-10-CM

## 2018-05-31 DIAGNOSIS — R079 Chest pain, unspecified: Secondary | ICD-10-CM | POA: Diagnosis not present

## 2018-05-31 DIAGNOSIS — I129 Hypertensive chronic kidney disease with stage 1 through stage 4 chronic kidney disease, or unspecified chronic kidney disease: Secondary | ICD-10-CM | POA: Diagnosis not present

## 2018-05-31 DIAGNOSIS — R931 Abnormal findings on diagnostic imaging of heart and coronary circulation: Secondary | ICD-10-CM | POA: Diagnosis not present

## 2018-05-31 DIAGNOSIS — N183 Chronic kidney disease, stage 3 (moderate): Secondary | ICD-10-CM | POA: Diagnosis not present

## 2018-05-31 DIAGNOSIS — I25118 Atherosclerotic heart disease of native coronary artery with other forms of angina pectoris: Secondary | ICD-10-CM | POA: Diagnosis not present

## 2018-05-31 DIAGNOSIS — R9439 Abnormal result of other cardiovascular function study: Secondary | ICD-10-CM | POA: Diagnosis not present

## 2018-05-31 DIAGNOSIS — I10 Essential (primary) hypertension: Secondary | ICD-10-CM | POA: Diagnosis not present

## 2018-05-31 LAB — NM MYOCAR MULTI W/SPECT W/WALL MOTION / EF
CHL CUP RESTING HR STRESS: 62 {beats}/min
Peak HR: 80 {beats}/min

## 2018-05-31 MED ORDER — ASPIRIN 81 MG PO CHEW
81.0000 mg | CHEWABLE_TABLET | ORAL | Status: AC
  Administered 2018-06-01: 81 mg via ORAL
  Filled 2018-05-31: qty 1

## 2018-05-31 MED ORDER — SODIUM CHLORIDE 0.9 % WEIGHT BASED INFUSION
3.0000 mL/kg/h | INTRAVENOUS | Status: DC
  Administered 2018-06-01: 3 mL/kg/h via INTRAVENOUS

## 2018-05-31 MED ORDER — SODIUM CHLORIDE 0.9 % IV SOLN: 250.0000 mL | INTRAVENOUS | Status: DC | PRN

## 2018-05-31 MED ORDER — SODIUM CHLORIDE 0.9 % WEIGHT BASED INFUSION
1.0000 mL/kg/h | INTRAVENOUS | Status: DC
  Administered 2018-06-01: 1 mL/kg/h via INTRAVENOUS

## 2018-05-31 MED ORDER — TECHNETIUM TC 99M TETROFOSMIN IV KIT
10.0000 | PACK | Freq: Once | INTRAVENOUS | Status: AC | PRN
  Administered 2018-05-31: 10 via INTRAVENOUS

## 2018-05-31 MED ORDER — SODIUM CHLORIDE 0.9% FLUSH: 3.0000 mL | INTRAVENOUS | Status: DC | PRN

## 2018-05-31 MED ORDER — TECHNETIUM TC 99M TETROFOSMIN IV KIT
30.0000 | PACK | Freq: Once | INTRAVENOUS | Status: AC | PRN
  Administered 2018-05-31: 30 via INTRAVENOUS

## 2018-05-31 MED ORDER — SODIUM CHLORIDE 0.9% FLUSH
3.0000 mL | Freq: Two times a day (BID) | INTRAVENOUS | Status: DC
  Administered 2018-05-31: 3 mL via INTRAVENOUS

## 2018-05-31 MED ORDER — REGADENOSON 0.4 MG/5ML IV SOLN
0.4000 mg | Freq: Once | INTRAVENOUS | Status: AC
  Administered 2018-05-31: 0.4 mg via INTRAVENOUS

## 2018-05-31 MED ORDER — REGADENOSON 0.4 MG/5ML IV SOLN
INTRAVENOUS | Status: AC
  Filled 2018-05-31: qty 5

## 2018-05-31 NOTE — Progress Notes (Signed)
Progress Note    YUSUF YU  ZOX:[id number] DOB: March 12, 1946  DOA: 05/29/2018 PCP: Patient, No Pcp Per    Brief Narrative:     Medical records reviewed and are as summarized below:  DAREY HERSHBERGER is an 72 y.o. male with medical history significant for coronary artery disease with stents, hypertension, and chronic kidney disease stage III, now presenting to the emergency department for evaluation of chest pain.  Patient reports having stents placed in 2014 and experiencing chest discomfort a couple times a year since then, but over the past week has had several episodes.  Patient is physically active, but developed chest pain while at rest, moderate to severe in intensity, in the central chest with radiation towards his jaw, associated with mild nausea but no vomiting.  Symptoms improved after 2 nitroglycerin and then completely resolved in the ED.  He had a similar episode the day before, also at rest, and also improved with nitroglycerin.  He denies any shortness of breath, leg swelling, or leg tenderness.    Assessment/Plan:   Principal Problem:   Chest pain Active Problems:   HTN (hypertension), benign   Coronary artery disease   CKD (chronic kidney disease), stage III (HCC)  Chest pain; CAD  - Pt reports hx of CAD with stent, now p/w increasingly frequent episodes of chest pain that improve/resolve with NTG  - EKG without acute ischemic features, CXR unremarkable, and troponin undetectable  - intermediate risk ST -plan for cath in AM    Hypertension  - BP at goal  - Continue Norvasc and Lopressor    CKD stage III  - SCr is 1.28 on admission, consistent with his apparent baseline  - Renally-dose medications, avoid nephrotoxins    Body mass index is 26.58 kg/m.     Medical Consultants:    cards     Subjective:   No current chest pain  Objective:    Vitals:   05/31/18 0940 05/31/18 0941 05/31/18 1051 05/31/18 1544  BP: (!) 154/92 (!)  155/92 (!) (P) 167/87 (!) 160/85  Pulse:   (!) (P) 57 (!) 51  Resp:    18  Temp:   (P) 97.9 F (36.6 C) 98.2 F (36.8 C)  TempSrc:   (P) Oral Oral  SpO2:    98%  Weight:      Height:        Intake/Output Summary (Last 24 hours) at 05/31/2018 1550 Last data filed at 05/30/2018 2200 Gross per 24 hour  Intake 2200 ml  Output -  Net 2200 ml   Filed Weights   05/29/18 2221 05/30/18 1200  Weight: 74.8 kg (165 lb) 79.3 kg (174 lb 13.2 oz)    Exam: In bed, NAD  Data Reviewed:   I have personally reviewed following labs and imaging studies:  Labs: Labs show the following:   Basic Metabolic Panel: Recent Labs  Lab 05/29/18 2225  NA 140  K 3.8  CL 108  CO2 21*  GLUCOSE 162*  BUN 27*  CREATININE 1.28*  CALCIUM 8.8*   GFR Estimated Creatinine Clearance: 51.2 mL/min (A) (by C-G formula based on SCr of 1.28 mg/dL (H)). Liver Function Tests: No results for input(s): AST, ALT, ALKPHOS, BILITOT, PROT, ALBUMIN in the last 168 hours. No results for input(s): LIPASE, AMYLASE in the last 168 hours. No results for input(s): AMMONIA in the last 168 hours. Coagulation profile No results for input(s): INR, PROTIME in the last 168 hours.  CBC: Recent Labs  Lab 05/29/18 2225  WBC 7.8  HGB 13.9  HCT 42.6  MCV 95.7  PLT 164   Cardiac Enzymes: Recent Labs  Lab 05/30/18 1013 05/30/18 1427 05/30/18 1953  TROPONINI <0.03 <0.03 <0.03   BNP (last 3 results) No results for input(s): PROBNP in the last 8760 hours. CBG: No results for input(s): GLUCAP in the last 168 hours. D-Dimer: No results for input(s): DDIMER in the last 72 hours. Hgb A1c: No results for input(s): HGBA1C in the last 72 hours. Lipid Profile: No results for input(s): CHOL, HDL, LDLCALC, TRIG, CHOLHDL, LDLDIRECT in the last 72 hours. Thyroid function studies: No results for input(s): TSH, T4TOTAL, T3FREE, THYROIDAB in the last 72 hours.  Invalid input(s): FREET3 Anemia work up: No results for  input(s): VITAMINB12, FOLATE, FERRITIN, TIBC, IRON, RETICCTPCT in the last 72 hours. Sepsis Labs: Recent Labs  Lab 05/29/18 2225  WBC 7.8    Microbiology No results found for this or any previous visit (from the past 240 hour(s)).  Procedures and diagnostic studies:  Nm Myocar Multi W/spect W/wall Motion / Ef  Result Date: 05/31/2018  There was no ST segment deviation noted during stress.  The left ventricular ejection fraction is mildly decreased (45-54%).  Nuclear stress EF: 51%.  Defect 1: There is a medium defect of moderate severity present in the mid anteroseptal and apical septal location.  Findings consistent with ischemia in the LAD territory.  This is an intermediate risk study.  Donato SchultzMark Skains, MD   Dg Chest Portable 1 View  Result Date: 05/29/2018 CLINICAL DATA:  Left chest pain starting last night EXAM: PORTABLE CHEST 1 VIEW COMPARISON:  07/23/2016 FINDINGS: Atherosclerotic calcification of the aortic arch. The lungs appear clear. Heart size within normal limits for technique. No pleural effusion. IMPRESSION: 1. No cause for left chest pain is identified on today's chest radiograph. 2.  Aortic Atherosclerosis (ICD10-I70.0). Electronically Signed   By: Gaylyn RongWalter  Liebkemann M.D.   On: 05/29/2018 23:26    Medications:   . amLODipine  5 mg Oral Daily  . aspirin  324 mg Oral Once  . aspirin  81 mg Oral Daily  . clopidogrel  75 mg Oral Daily  . enoxaparin (LOVENOX) injection  40 mg Subcutaneous Q24H  . ezetimibe  5 mg Oral QHS  . famotidine  20 mg Oral Daily  . metoprolol tartrate  12.5 mg Oral Daily  .  morphine injection  4 mg Intravenous Once  . omega-3 acid ethyl esters  1 g Oral Daily  . pravastatin  5 mg Oral QHS  . regadenoson      . vitamin B-12  500 mcg Oral Daily   Continuous Infusions:   LOS: 0 days   Joseph ArtJessica U Beretta Ginsberg  [facility]   *Please refer to [email or web address], password [email or web address] to get updated schedule on who will round on this patient, as hospitalists  switch teams weekly. If 7PM-7AM, please contact night-coverage at [email or web address], password [email or web address] for any overnight needs.  05/31/2018, 3:50 PM

## 2018-05-31 NOTE — Care Management Obs Status (Signed)
MEDICARE OBSERVATION STATUS NOTIFICATION   Patient Details  Name: Todd CoriaJohn A Mills MRN: [id number] Date of Birth: 02/22/1946   Medicare Observation Status Notification Given:  Yes    Leone Havenaylor, Jaydyn Menon Clinton, RN 05/31/2018, 1:07 PM

## 2018-05-31 NOTE — Progress Notes (Addendum)
   Lexiscan myoview findings consistent with ischemia in the LAD territory. EF 51%. Intermediate risk study.   Plan for cardiac cath tomorrow morning. I discussed the results and cardiac cath option with the patient and his wife. The patient understands that risks included but are not limited to stroke (1 in 1000), death (1 in 1000), kidney failure [usually temporary] (1 in 500), bleeding (1 in 200), allergic reaction [possibly serious] (1 in 200).  The patient and his wife are in agreement and wish to proceed with cath. Dr. Tresa EndoKelly is to do the cath and they know him from his prior cath.   Todd Mills, AGNP-C [facility] 05/31/2018  3:06 PM Pager: [phone]  I have examined the patient and reviewed assessment and plan and discussed with patient.  Agree with above as stated.    Todd Mills

## 2018-05-31 NOTE — Progress Notes (Signed)
    Patient presented for Lexiscan nuclear stress test. Tolerated procedure well. Pending final stress imaging result.  Berton BonJanine Kapono Luhn, AGNP-C 05/31/2018  9:49 AM Pager: [phone]

## 2018-05-31 NOTE — H&P (View-Only) (Signed)
 Progress Note  Patient Name: Todd Mills Date of Encounter: 05/31/2018  Primary Cardiologist: Cardiologist in [place]  Subjective   Pt seen in stress lab. No chest pain since yesterday. No shortness of breath. Has some mild epigastric discomfort after eating last night but did not last long.   Inpatient Medications    Scheduled Meds: . regadenoson      . amLODipine  5 mg Oral Daily  . aspirin  324 mg Oral Once  . aspirin  81 mg Oral Daily  . clopidogrel  75 mg Oral Daily  . enoxaparin (LOVENOX) injection  40 mg Subcutaneous Q24H  . ezetimibe  5 mg Oral QHS  . famotidine  20 mg Oral Daily  . metoprolol tartrate  12.5 mg Oral Daily  .  morphine injection  4 mg Intravenous Once  . omega-3 acid ethyl esters  1 g Oral Daily  . pravastatin  5 mg Oral QHS  . vitamin B-12  500 mcg Oral Daily   Continuous Infusions:  PRN Meds: acetaminophen, ALPRAZolam, morphine injection, nitroGLYCERIN, ondansetron (ZOFRAN) IV   Vital Signs    Vitals:   05/31/18 0920 05/31/18 0938 05/31/18 0939 05/31/18 0940  BP: (!) 203/112 (!) 188/103 131/89 (!) 154/92  Pulse: (!) 53     Resp:      Temp:      TempSrc:      SpO2:      Weight:      Height:        Intake/Output Summary (Last 24 hours) at 05/31/2018 0942 Last data filed at 05/30/2018 2200 Gross per 24 hour  Intake 2425.05 ml  Output -  Net 2425.05 ml   Filed Weights   05/29/18 2221 05/30/18 1200  Weight: 165 lb (74.8 kg) 174 lb 13.2 oz (79.3 kg)    Telemetry    SR/SB - Personally Reviewed  ECG    Sinus bradycardia 52 bpm, no ischemic changes - Personally Reviewed  Physical Exam   GEN: No acute distress.   Neck: No JVD Cardiac: RRR, no murmurs, rubs, or gallops.  Respiratory: Clear to auscultation bilaterally. GI: Soft, nontender, non-distended  MS: No edema; No deformity. Neuro:  Nonfocal  Psych: Normal affect   Labs    Chemistry Recent Labs  Lab 05/29/18 2225  NA 140  K 3.8  CL 108  CO2 21*  GLUCOSE  162*  BUN 27*  CREATININE 1.28*  CALCIUM 8.8*  GFRNONAA 55*  GFRAA >60  ANIONGAP 11     Hematology Recent Labs  Lab 05/29/18 2225  WBC 7.8  RBC 4.45  HGB 13.9  HCT 42.6  MCV 95.7  MCH 31.2  MCHC 32.6  RDW 12.5  PLT 164    Cardiac Enzymes Recent Labs  Lab 05/30/18 1013 05/30/18 1427 05/30/18 1953  TROPONINI <0.03 <0.03 <0.03    Recent Labs  Lab 05/29/18 2230  TROPIPOC 0.00     BNPNo results for input(s): BNP, PROBNP in the last 168 hours.   DDimer No results for input(s): DDIMER in the last 168 hours.   Radiology    Dg Chest Portable 1 View  Result Date: 05/29/2018 CLINICAL DATA:  Left chest pain starting last night EXAM: PORTABLE CHEST 1 VIEW COMPARISON:  07/23/2016 FINDINGS: Atherosclerotic calcification of the aortic arch. The lungs appear clear. Heart size within normal limits for technique. No pleural effusion. IMPRESSION: 1. No cause for left chest pain is identified on today's chest radiograph. 2.  Aortic Atherosclerosis (ICD10-I70.0). Electronically Signed     By: Walter  Liebkemann M.D.   On: 05/29/2018 23:26    Cardiac Studies   Echo: 9/17  Study Conclusions - Left ventricle: The cavity size was normal. Wall thickness was normal. Systolic function was normal. The estimated ejection fraction was in the range of 55% to 60%. Wall motion was normal; there were no regional wall motion abnormalities. Doppler parameters are consistent with abnormal left ventricular relaxation (grade 1 diastolic dysfunction). - Left atrium: The atrium was mildly dilated.   Patient Profile     72 y.o. male with PMH of CAD s/p stents in RCA, and Lcx, HTN, HL and CKD who presented with chest pain.  Assessment & Plan    Chest pain -Troponins negative.  -EKG non-ischemic -No chest pain since presentation. Had some mild epigastric discomfort after eating last night but did not last long.  -BP elevated in stress lab, converted to Lexiscan.   CAD s/p stents  to RCA, and Lcx: Has been on DAPT since 2014 when RCA stents were placed. Will continue with the same. Also on BB and statin.   3. HTN: BP elevated this am, improved with lexiscan. Continue current regimen and follow BP.  4. HLD: on statin and Zetia    For questions or updates, please contact [facility] Please consult [email or web address] for contact info under Cardiology/STEMI.      Signed, Janine Hammond, NP  05/31/2018, 9:42 AM    I have examined the patient and reviewed assessment and plan and discussed with patient.  Agree with above as stated.  Abnormal stress test.  Evidence of anterior ischemia.  We will plan for cardiac cath tomorrow.  All questions about the procedure answered.  Todd Mills  

## 2018-05-31 NOTE — Progress Notes (Addendum)
Progress Note  Patient Name: Todd Mills Date of Encounter: 05/31/2018  Primary Cardiologist: Cardiologist in [place]  Subjective   Pt seen in stress lab. No chest pain since yesterday. No shortness of breath. Has some mild epigastric discomfort after eating last night but did not last long.   Inpatient Medications    Scheduled Meds: . regadenoson      . amLODipine  5 mg Oral Daily  . aspirin  324 mg Oral Once  . aspirin  81 mg Oral Daily  . clopidogrel  75 mg Oral Daily  . enoxaparin (LOVENOX) injection  40 mg Subcutaneous Q24H  . ezetimibe  5 mg Oral QHS  . famotidine  20 mg Oral Daily  . metoprolol tartrate  12.5 mg Oral Daily  .  morphine injection  4 mg Intravenous Once  . omega-3 acid ethyl esters  1 g Oral Daily  . pravastatin  5 mg Oral QHS  . vitamin B-12  500 mcg Oral Daily   Continuous Infusions:  PRN Meds: acetaminophen, ALPRAZolam, morphine injection, nitroGLYCERIN, ondansetron (ZOFRAN) IV   Vital Signs    Vitals:   05/31/18 0920 05/31/18 0938 05/31/18 0939 05/31/18 0940  BP: (!) 203/112 (!) 188/103 131/89 (!) 154/92  Pulse: (!) 53     Resp:      Temp:      TempSrc:      SpO2:      Weight:      Height:        Intake/Output Summary (Last 24 hours) at 05/31/2018 0942 Last data filed at 05/30/2018 2200 Gross per 24 hour  Intake 2425.05 ml  Output -  Net 2425.05 ml   Filed Weights   05/29/18 2221 05/30/18 1200  Weight: 165 lb (74.8 kg) 174 lb 13.2 oz (79.3 kg)    Telemetry    SR/SB - Personally Reviewed  ECG    Sinus bradycardia 52 bpm, no ischemic changes - Personally Reviewed  Physical Exam   GEN: No acute distress.   Neck: No JVD Cardiac: RRR, no murmurs, rubs, or gallops.  Respiratory: Clear to auscultation bilaterally. GI: Soft, nontender, non-distended  MS: No edema; No deformity. Neuro:  Nonfocal  Psych: Normal affect   Labs    Chemistry Recent Labs  Lab 05/29/18 2225  NA 140  K 3.8  CL 108  CO2 21*  GLUCOSE  162*  BUN 27*  CREATININE 1.28*  CALCIUM 8.8*  GFRNONAA 55*  GFRAA >60  ANIONGAP 11     Hematology Recent Labs  Lab 05/29/18 2225  WBC 7.8  RBC 4.45  HGB 13.9  HCT 42.6  MCV 95.7  MCH 31.2  MCHC 32.6  RDW 12.5  PLT 164    Cardiac Enzymes Recent Labs  Lab 05/30/18 1013 05/30/18 1427 05/30/18 1953  TROPONINI <0.03 <0.03 <0.03    Recent Labs  Lab 05/29/18 2230  TROPIPOC 0.00     BNPNo results for input(s): BNP, PROBNP in the last 168 hours.   DDimer No results for input(s): DDIMER in the last 168 hours.   Radiology    Dg Chest Portable 1 View  Result Date: 05/29/2018 CLINICAL DATA:  Left chest pain starting last night EXAM: PORTABLE CHEST 1 VIEW COMPARISON:  07/23/2016 FINDINGS: Atherosclerotic calcification of the aortic arch. The lungs appear clear. Heart size within normal limits for technique. No pleural effusion. IMPRESSION: 1. No cause for left chest pain is identified on today's chest radiograph. 2.  Aortic Atherosclerosis (ICD10-I70.0). Electronically Signed  By: Gaylyn RongWalter  Liebkemann M.D.   On: 05/29/2018 23:26    Cardiac Studies   Echo: 9/17  Study Conclusions - Left ventricle: The cavity size was normal. Wall thickness was normal. Systolic function was normal. The estimated ejection fraction was in the range of 55% to 60%. Wall motion was normal; there were no regional wall motion abnormalities. Doppler parameters are consistent with abnormal left ventricular relaxation (grade 1 diastolic dysfunction). - Left atrium: The atrium was mildly dilated.   Patient Profile     72 y.o. male with PMH of CAD s/p stents in RCA, and Lcx, HTN, HL and CKD who presented with chest pain.  Assessment & Plan    Chest pain -Troponins negative.  -EKG non-ischemic -No chest pain since presentation. Had some mild epigastric discomfort after eating last night but did not last long.  -BP elevated in stress lab, converted to Lexiscan.   CAD s/p stents  to RCA, and Lcx: Has been on DAPT since 2014 when RCA stents were placed. Will continue with the same. Also on BB and statin.   3. HTN: BP elevated this am, improved with lexiscan. Continue current regimen and follow BP.  4. HLD: on statin and Zetia    For questions or updates, please contact [facility] Please consult [email or web address] for contact info under Cardiology/STEMI.      Signed, Berton BonJanine Hammond, NP  05/31/2018, 9:42 AM    I have examined the patient and reviewed assessment and plan and discussed with patient.  Agree with above as stated.  Abnormal stress test.  Evidence of anterior ischemia.  We will plan for cardiac cath tomorrow.  All questions about the procedure answered.  Lance MussJayadeep Cliffie Gingras

## 2018-06-01 ENCOUNTER — Encounter (HOSPITAL_COMMUNITY): Payer: Self-pay | Admitting: Cardiovascular Disease

## 2018-06-01 ENCOUNTER — Encounter (HOSPITAL_COMMUNITY)
Admission: EM | Disposition: A | Discharge status: Home / Self Care | Payer: Self-pay | Source: Home / Self Care | Attending: Emergency Medicine

## 2018-06-01 DIAGNOSIS — N183 Chronic kidney disease, stage 3 (moderate): Secondary | ICD-10-CM | POA: Diagnosis not present

## 2018-06-01 DIAGNOSIS — R072 Precordial pain: Secondary | ICD-10-CM | POA: Diagnosis not present

## 2018-06-01 DIAGNOSIS — I10 Essential (primary) hypertension: Secondary | ICD-10-CM | POA: Diagnosis not present

## 2018-06-01 DIAGNOSIS — I25118 Atherosclerotic heart disease of native coronary artery with other forms of angina pectoris: Secondary | ICD-10-CM | POA: Diagnosis not present

## 2018-06-01 HISTORY — PX: LEFT HEART CATH AND CORONARY ANGIOGRAPHY: CATH118249

## 2018-06-01 LAB — BASIC METABOLIC PANEL
ANION GAP: 7 (ref 5–15)
BUN: 19 mg/dL (ref 8–23)
CALCIUM: 8.8 mg/dL — AB (ref 8.9–10.3)
CO2: 27 mmol/L (ref 22–32)
Chloride: 107 mmol/L (ref 98–111)
Creatinine, Ser: 1.3 mg/dL — ABNORMAL HIGH (ref 0.61–1.24)
GFR, EST NON AFRICAN AMERICAN: 54 mL/min — AB (ref >60)
Glucose, Bld: 111 mg/dL — ABNORMAL HIGH (ref 70–99)
POTASSIUM: 3.5 mmol/L (ref 3.5–5.1)
Sodium: 141 mmol/L (ref 135–145)

## 2018-06-01 LAB — MRSA PCR SCREENING: MRSA by PCR: NEGATIVE

## 2018-06-01 LAB — CBC
HCT: 44 % (ref 39.0–52.0)
Hemoglobin: 14.3 g/dL (ref 13.0–17.0)
MCH: 30.8 pg (ref 26.0–34.0)
MCHC: 32.5 g/dL (ref 30.0–36.0)
MCV: 94.6 fL (ref 78.0–100.0)
PLATELETS: 167 10*3/uL (ref 150–400)
RBC: 4.65 MIL/uL (ref 4.22–5.81)
RDW: 12.7 % (ref 11.5–15.5)
WBC: 7.4 10*3/uL (ref 4.0–10.5)

## 2018-06-01 SURGERY — LEFT HEART CATH AND CORONARY ANGIOGRAPHY
Anesthesia: LOCAL

## 2018-06-01 MED ORDER — FENTANYL CITRATE (PF) 100 MCG/2ML IJ SOLN
INTRAMUSCULAR | Status: AC
  Filled 2018-06-01: qty 2

## 2018-06-01 MED ORDER — VERAPAMIL HCL 2.5 MG/ML IV SOLN
INTRAVENOUS | Status: AC
  Filled 2018-06-01: qty 2

## 2018-06-01 MED ORDER — SODIUM CHLORIDE 0.9% FLUSH
3.0000 mL | Freq: Two times a day (BID) | INTRAVENOUS | Status: DC
  Administered 2018-06-01 – 2018-06-02 (×3): 3 mL via INTRAVENOUS

## 2018-06-01 MED ORDER — ONDANSETRON HCL 4 MG/2ML IJ SOLN: 4.0000 mg | Freq: Four times a day (QID) | INTRAMUSCULAR | Status: DC | PRN

## 2018-06-01 MED ORDER — CLOPIDOGREL BISULFATE 75 MG PO TABS
75.0000 mg | ORAL_TABLET | Freq: Every day | ORAL | Status: DC
  Administered 2018-06-02: 75 mg via ORAL
  Filled 2018-06-01: qty 1

## 2018-06-01 MED ORDER — LIDOCAINE HCL (PF) 1 % IJ SOLN
INTRAMUSCULAR | Status: DC | PRN
  Administered 2018-06-01: 2 mL

## 2018-06-01 MED ORDER — FENTANYL CITRATE (PF) 100 MCG/2ML IJ SOLN
INTRAMUSCULAR | Status: DC | PRN
  Administered 2018-06-01: 50 ug via INTRAVENOUS

## 2018-06-01 MED ORDER — HYDRALAZINE HCL 20 MG/ML IJ SOLN
10.0000 mg | Freq: Four times a day (QID) | INTRAMUSCULAR | Status: DC | PRN
  Administered 2018-06-01: 10 mg via INTRAVENOUS

## 2018-06-01 MED ORDER — VERAPAMIL HCL 2.5 MG/ML IV SOLN
INTRAVENOUS | Status: DC | PRN
  Administered 2018-06-01: 10 mL via INTRA_ARTERIAL

## 2018-06-01 MED ORDER — SODIUM CHLORIDE 0.9% FLUSH: 3.0000 mL | INTRAVENOUS | Status: DC | PRN

## 2018-06-01 MED ORDER — AMLODIPINE BESYLATE 5 MG PO TABS
5.0000 mg | ORAL_TABLET | Freq: Once | ORAL | Status: AC
  Administered 2018-06-01: 5 mg via ORAL

## 2018-06-01 MED ORDER — HEPARIN SODIUM (PORCINE) 1000 UNIT/ML IJ SOLN
INTRAMUSCULAR | Status: DC | PRN
  Administered 2018-06-01: 3500 [IU] via INTRAVENOUS

## 2018-06-01 MED ORDER — AMLODIPINE BESYLATE 10 MG PO TABS
10.0000 mg | ORAL_TABLET | Freq: Every day | ORAL | Status: DC
  Administered 2018-06-02: 10 mg via ORAL
  Filled 2018-06-01: qty 1

## 2018-06-01 MED ORDER — HEPARIN (PORCINE) IN NACL 1000-0.9 UT/500ML-% IV SOLN
INTRAVENOUS | Status: AC
  Filled 2018-06-01: qty 1000

## 2018-06-01 MED ORDER — MIDAZOLAM HCL 2 MG/2ML IJ SOLN
INTRAMUSCULAR | Status: DC | PRN
  Administered 2018-06-01: 2 mg via INTRAVENOUS

## 2018-06-01 MED ORDER — HEPARIN SODIUM (PORCINE) 1000 UNIT/ML IJ SOLN
INTRAMUSCULAR | Status: AC
  Filled 2018-06-01: qty 1

## 2018-06-01 MED ORDER — DIAZEPAM 5 MG PO TABS: 5.0000 mg | ORAL_TABLET | Freq: Four times a day (QID) | ORAL | Status: DC | PRN

## 2018-06-01 MED ORDER — LIDOCAINE HCL (PF) 1 % IJ SOLN
INTRAMUSCULAR | Status: AC
  Filled 2018-06-01: qty 30

## 2018-06-01 MED ORDER — IOHEXOL 350 MG/ML SOLN
INTRAVENOUS | Status: DC | PRN
  Administered 2018-06-01: 75 mL via INTRA_ARTERIAL

## 2018-06-01 MED ORDER — HYDRALAZINE HCL 20 MG/ML IJ SOLN
INTRAMUSCULAR | Status: AC
  Filled 2018-06-01: qty 1

## 2018-06-01 MED ORDER — MIDAZOLAM HCL 2 MG/2ML IJ SOLN
INTRAMUSCULAR | Status: AC
  Filled 2018-06-01: qty 2

## 2018-06-01 MED ORDER — HEPARIN (PORCINE) IN NACL 1000-0.9 UT/500ML-% IV SOLN
INTRAVENOUS | Status: DC | PRN
  Administered 2018-06-01 (×2): 500 mL

## 2018-06-01 MED ORDER — ACETAMINOPHEN 325 MG PO TABS: 650.0000 mg | ORAL_TABLET | ORAL | Status: DC | PRN

## 2018-06-01 MED ORDER — ASPIRIN 81 MG PO CHEW: 81.0000 mg | CHEWABLE_TABLET | Freq: Every day | ORAL | Status: DC

## 2018-06-01 MED ORDER — SODIUM CHLORIDE 0.9 % IV SOLN
250.0000 mL | INTRAVENOUS | Status: DC | PRN
  Administered 2018-06-02: 250 mL via INTRAVENOUS

## 2018-06-01 MED ORDER — AMLODIPINE BESYLATE 5 MG PO TABS
5.0000 mg | ORAL_TABLET | Freq: Once | ORAL | Status: DC
  Filled 2018-06-01: qty 1

## 2018-06-01 MED ORDER — SODIUM CHLORIDE 0.9 % IV SOLN
INTRAVENOUS | Status: DC
  Administered 2018-06-01 (×2): via INTRAVENOUS

## 2018-06-01 SURGICAL SUPPLY — 11 items
CATH INFINITI 5 FR JL3.5 (CATHETERS) ×2 IMPLANT
CATH INFINITI 5FR ANG PIGTAIL (CATHETERS) ×2 IMPLANT
CATH OPTITORQUE TIG 4.0 5F (CATHETERS) ×2 IMPLANT
DEVICE RAD COMP TR BAND LRG (VASCULAR PRODUCTS) ×2 IMPLANT
GLIDESHEATH SLEND SS 6F .021 (SHEATH) ×2 IMPLANT
GUIDEWIRE INQWIRE 1.5J.035X260 (WIRE) ×1 IMPLANT
INQWIRE 1.5J .035X260CM (WIRE) ×2
KIT HEART LEFT (KITS) ×2 IMPLANT
PACK CARDIAC CATHETERIZATION (CUSTOM PROCEDURE TRAY) ×2 IMPLANT
TRANSDUCER W/STOPCOCK (MISCELLANEOUS) ×2 IMPLANT
TUBING CIL FLEX 10 FLL-RA (TUBING) ×2 IMPLANT

## 2018-06-01 NOTE — Progress Notes (Signed)
Progress Note    DAEKWON BESWICK  RUE:[id number] DOB: June 07, 1946  DOA: 05/29/2018 PCP: Patient, No Pcp Per    Brief Narrative:     Medical records reviewed and are as summarized below:  HUBERT RAATZ is an 72 y.o. male with medical history significant for coronary artery disease with stents, hypertension, and chronic kidney disease stage III, came in for the evaluation of chest pain.  Patient reports having stents placed in 2014 and experiencing chest discomfort a couple times a year since then, but over the past week has had several episodes.  Patient is physically active, but developed chest pain while at rest, moderate to severe in intensity, in the central chest with radiation towards his jaw, associated with mild nausea but no vomiting.  Symptoms improved after 2 nitroglycerin and then completely resolved in the ED.  stress test was indeterminate and cath was done on 7/18 with mid systolic muscle LAD bridging and concomitant CAD   Assessment/Plan:   Principal Problem:   Chest pain Active Problems:   HTN (hypertension), benign   Coronary artery disease   CKD (chronic kidney disease), stage III (HCC)  Chest pain; CAD  - intermediate risk ST -s/p cath: recommendations:continuation of dual antiplatelet therapy with Aspirin 81mg  daily and Clopidogrel 75mg  daily long-term (beyond 12 months).Medical therapy for CAD.  The patient is on low-dose beta-blocker, recommend titration and addition of possible amlodipine or isosorbide in light of the mid systolic muscle LAD bridging and concomitant CAD.  High potency statin therapy with target LDL less than 70.    Hypertension  -increase norvasc to 10 for better BP control -BB held this AM due to bradycardia-- only on once daily metoprolol  CKD stage III  -  consistent with his apparent baseline  - IVF post cath and d/c once ok with cardiology     Medical Consultants:    cards     Subjective:   Doing well  post-cath  Objective:    Vitals:   06/01/18 1030 06/01/18 1045 06/01/18 1103 06/01/18 1145  BP: (!) 176/98 (!) 167/102 (!) 160/98 121/67  Pulse: (!) 51 (!) 53  67  Resp: 14 15  12   Temp:      TempSrc:      SpO2: 96% 97%  96%  Weight:      Height:        Intake/Output Summary (Last 24 hours) at 06/01/2018 1208 Last data filed at 06/01/2018 1108 Gross per 24 hour  Intake 1079.81 ml  Output -  Net 1079.81 ml   Filed Weights   05/30/18 1200 05/31/18 1700 06/01/18 0605  Weight: 79.3 kg (174 lb 13.2 oz) 73.8 kg (162 lb 9.6 oz) 73.3 kg (161 lb 8 oz)    Exam: NAD No LE edema No increased work of breathing  Data Reviewed:   I have personally reviewed following labs and imaging studies:  Labs: Labs show the following:   Basic Metabolic Panel: Recent Labs  Lab 05/29/18 2225 06/01/18 0428  NA 140 141  K 3.8 3.5  CL 108 107  CO2 21* 27  GLUCOSE 162* 111*  BUN 27* 19  CREATININE 1.28* 1.30*  CALCIUM 8.8* 8.8*   GFR Estimated Creatinine Clearance: 50.4 mL/min (A) (by C-G formula based on SCr of 1.3 mg/dL (H)). Liver Function Tests: No results for input(s): AST, ALT, ALKPHOS, BILITOT, PROT, ALBUMIN in the last 168 hours. No results for input(s): LIPASE, AMYLASE in the last 168 hours. No results for  input(s): AMMONIA in the last 168 hours. Coagulation profile No results for input(s): INR, PROTIME in the last 168 hours.  CBC: Recent Labs  Lab 05/29/18 2225 06/01/18 0428  WBC 7.8 7.4  HGB 13.9 14.3  HCT 42.6 44.0  MCV 95.7 94.6  PLT 164 167   Cardiac Enzymes: Recent Labs  Lab 05/30/18 1013 05/30/18 1427 05/30/18 1953  TROPONINI <0.03 <0.03 <0.03   BNP (last 3 results) No results for input(s): PROBNP in the last 8760 hours. CBG: No results for input(s): GLUCAP in the last 168 hours. D-Dimer: No results for input(s): DDIMER in the last 72 hours. Hgb A1c: No results for input(s): HGBA1C in the last 72 hours. Lipid Profile: No results for input(s):  CHOL, HDL, LDLCALC, TRIG, CHOLHDL, LDLDIRECT in the last 72 hours. Thyroid function studies: No results for input(s): TSH, T4TOTAL, T3FREE, THYROIDAB in the last 72 hours.  Invalid input(s): FREET3 Anemia work up: No results for input(s): VITAMINB12, FOLATE, FERRITIN, TIBC, IRON, RETICCTPCT in the last 72 hours. Sepsis Labs: Recent Labs  Lab 05/29/18 2225 06/01/18 0428  WBC 7.8 7.4    Microbiology Recent Results (from the past 240 hour(s))  MRSA PCR Screening     Status: None   Collection Time: 06/01/18  9:15 AM  Result Value Ref Range Status   MRSA by PCR NEGATIVE NEGATIVE Final    Comment:        The GeneXpert MRSA Assay (FDA approved for NASAL specimens only), is one component of a comprehensive MRSA colonization surveillance program. It is not intended to diagnose MRSA infection nor to guide or monitor treatment for MRSA infections. Performed at [facility], [address] [place], [place] [address]     Procedures and diagnostic studies:  Nm Myocar Multi W/spect W/wall Motion / Ef  Result Date: 05/31/2018  There was no ST segment deviation noted during stress.  The left ventricular ejection fraction is mildly decreased (45-54%).  Nuclear stress EF: 51%.  Defect 1: There is a medium defect of moderate severity present in the mid anteroseptal and apical septal location.  Findings consistent with ischemia in the LAD territory.  This is an intermediate risk study.  Donato SchultzMark Skains, MD    Medications:   . [START ON 06/02/2018] amLODipine  10 mg Oral Daily  . aspirin  324 mg Oral Once  . aspirin  81 mg Oral Daily  . [START ON 06/02/2018] clopidogrel  75 mg Oral Q breakfast  . enoxaparin (LOVENOX) injection  40 mg Subcutaneous Q24H  . ezetimibe  5 mg Oral QHS  . famotidine  20 mg Oral Daily  . metoprolol tartrate  12.5 mg Oral Daily  .  morphine injection  4 mg Intravenous Once  . omega-3 acid ethyl esters  1 g Oral Daily  . pravastatin  5 mg Oral QHS  .  sodium chloride flush  3 mL Intravenous Q12H  . vitamin B-12  500 mcg Oral Daily   Continuous Infusions: . sodium chloride 125 mL/hr at 06/01/18 1111  . sodium chloride       LOS: 0 days   Joseph ArtJessica U Anthonymichael Munday  [facility]   *Please refer to [email or web address], password [email or web address] to get updated schedule on who will round on this patient, as hospitalists switch teams weekly. If 7PM-7AM, please contact night-coverage at [email or web address], password [email or web address] for any overnight needs.  06/01/2018, 12:08 PM

## 2018-06-01 NOTE — Interval H&P Note (Signed)
Cath Lab Visit (complete for each Cath Lab visit)  Clinical Evaluation Leading to the Procedure:   ACS: No.  Non-ACS:    Anginal Classification: CCS III  Anti-ischemic medical therapy: Minimal Therapy (1 class of medications)  Non-Invasive Test Results: Intermediate-risk stress test findings: cardiac mortality 1-3%/year  Prior CABG: No previous CABG      History and Physical Interval Note:  06/01/2018 7:37 AM  Todd Mills  has presented today for surgery, with the diagnosis of as  The various methods of treatment have been discussed with the patient and family. After consideration of risks, benefits and other options for treatment, the patient has consented to  Procedure(s): LEFT HEART CATH AND CORONARY ANGIOGRAPHY (N/A) as a surgical intervention .  The patient's history has been reviewed, patient examined, no change in status, stable for surgery.  I have reviewed the patient's chart and labs.  Questions were answered to the patient's satisfaction.     Todd Mills

## 2018-06-02 DIAGNOSIS — N183 Chronic kidney disease, stage 3 (moderate): Secondary | ICD-10-CM

## 2018-06-02 DIAGNOSIS — R079 Chest pain, unspecified: Secondary | ICD-10-CM

## 2018-06-02 DIAGNOSIS — I25118 Atherosclerotic heart disease of native coronary artery with other forms of angina pectoris: Secondary | ICD-10-CM

## 2018-06-02 DIAGNOSIS — I10 Essential (primary) hypertension: Secondary | ICD-10-CM | POA: Diagnosis not present

## 2018-06-02 LAB — CBC
HCT: 42.7 % (ref 39.0–52.0)
Hemoglobin: 14.2 g/dL (ref 13.0–17.0)
MCH: 31.3 pg (ref 26.0–34.0)
MCHC: 33.3 g/dL (ref 30.0–36.0)
MCV: 94.1 fL (ref 78.0–100.0)
Platelets: 162 K/uL (ref 150–400)
RBC: 4.54 MIL/uL (ref 4.22–5.81)
RDW: 12.6 % (ref 11.5–15.5)
WBC: 11 K/uL — ABNORMAL HIGH (ref 4.0–10.5)

## 2018-06-02 LAB — BASIC METABOLIC PANEL WITH GFR
Anion gap: 8 (ref 5–15)
BUN: 19 mg/dL (ref 8–23)
CO2: 22 mmol/L (ref 22–32)
Calcium: 8.4 mg/dL — ABNORMAL LOW (ref 8.9–10.3)
Chloride: 109 mmol/L (ref 98–111)
Creatinine, Ser: 1.23 mg/dL (ref 0.61–1.24)
GFR calc Af Amer: >60 mL/min (ref >60)
GFR calc non Af Amer: 57 mL/min — ABNORMAL LOW (ref >60)
Glucose, Bld: 110 mg/dL — ABNORMAL HIGH (ref 70–99)
Potassium: 3.5 mmol/L (ref 3.5–5.1)
Sodium: 139 mmol/L (ref 135–145)

## 2018-06-02 MED ORDER — ATORVASTATIN CALCIUM 40 MG PO TABS: 40.0000 mg | ORAL_TABLET | Freq: Every day | ORAL | 11 refills | Status: DC

## 2018-06-02 MED ORDER — AMLODIPINE BESYLATE 10 MG PO TABS: 10.0000 mg | ORAL_TABLET | Freq: Every day | ORAL | 3 refills | Status: DC

## 2018-06-02 NOTE — Discharge Summary (Signed)
Physician Discharge Summary  Todd Mills YNW:[id number] DOB: September 13, 1946 DOA: 05/29/2018  PCP: Patient, No Pcp Per  Admit date: 05/29/2018 Discharge date: 06/02/2018  Admitted From: Home  Disposition:  Home  Recommendations for Outpatient Follow-up:  1. Follow up with Cardiology in 2-3 weeks 2. Please obtain lipids, LFT in 3 months   Home Health: None  Equipment/Devices: None  Discharge Condition: Good  CODE STATUS: FULL Diet recommendation: Cardiac  Brief/Interim Summary: Todd Mills is an 72 y.o. male with medical history significant forcoronary artery disease with stents, hypertension, and chronic kidney disease stage III, came in for the evaluation of chest pain. Patient reports having stents placed in 2014 and experiencing chest discomfort a couple times a year since then, but over the past week has had several episodes. Patient is physically active, but developed chest pain while at rest, moderate to severe in intensity, in the central chest with radiation towards his jaw, associated with mild nausea but no vomiting. Symptoms improved after 2 nitroglycerin and then completely resolved in the ED.         Discharge Diagnoses:    High risk chest pain  Coronary artery disease ACS was not present.  Troponins negative. Stress test intermediate risk.  Follow up catheterization recommendations: Recommend continuation of dual antiplatelet therapy with Aspirin 81mg  daily and Clopidogrel 75mg  daily long-term (beyond 12 months).Medical therapy for CAD.  The patient is on low-dose beta-blocker, recommend titration and addition of possible amlodipine or isosorbide in light of the mid systolic muscle LAD bridging and concomitant CAD.  High potency statin therapy with target LDL less than 70.    Hypertension Norvasc dose increased.    CKD stage III Stable relative to baseline.         Discharge Instructions  Discharge Instructions    Diet - low sodium heart  healthy   Complete by:  As directed    Discharge instructions   Complete by:  As directed    From Dr. Maryfrances Bunnell: You were admitted with chest pain. On your stress test, there was an abnormal signal, and so you were recommended to undergo a "heart cath". On the cath, there was nothing that required a stent.  Dr. Tresa Endo made the following recommendations: -Continue dual antiplatelet therapy with Aspirin 81mg  daily and Clopidogrel 75mg  daily long-term (beyond 12 months). -Recommend increase of amlodipine in light of the mid systolic muscle LAD bridging (this is where the blood vessel dips in to the muscle)  -High potency statin therapy with target LDL less than 70   So continue your aspirin 81 mg daily and Plavix 75 mg daily INCREASE your amlodipine to 10 mg daily (I have sent a new rx for this to the [facility] pharmacy on file) Change your cholesterol medicine from pravastatin to atorvastatin 40 mg nightly  I have sent a request to Dr. Landry Dyke office for a follow up appointment with them in a few weeks. If you haven't heard from his office by next week, call them to schedule.   Increase activity slowly   Complete by:  As directed      Allergies as of 06/02/2018   No Known Allergies     Medication List    STOP taking these medications   pravastatin 10 MG tablet Commonly known as:  PRAVACHOL     TAKE these medications   amLODipine 10 MG tablet Commonly known as:  NORVASC Take 1 tablet (10 mg total) by mouth daily. What changed:    medication strength  how much to take   aspirin 81 MG chewable tablet Chew 81 mg by mouth daily.   atorvastatin 40 MG tablet Commonly known as:  LIPITOR Take 1 tablet (40 mg total) by mouth daily.   clopidogrel 75 MG tablet Commonly known as:  PLAVIX Take 75 mg by mouth daily.   CoQ10 100 MG Caps Take 100 mg by mouth daily.   ezetimibe 10 MG tablet Commonly known as:  ZETIA Take 5 mg by mouth at bedtime.   Fish Oil 1000 MG Caps Take 1  capsule by mouth daily.   FLAXSEED OIL PO Take 1 tablet by mouth daily.   metoprolol tartrate 25 MG tablet Commonly known as:  LOPRESSOR Take 12.5 mg by mouth daily.   nitroGLYCERIN 0.4 MG SL tablet Commonly known as:  NITROSTAT Place 1 tablet (0.4 mg total) under the tongue every 5 (five) minutes x 3 doses as needed for chest pain.   ranitidine 150 MG tablet Commonly known as:  ZANTAC Take 150 mg by mouth daily.   vitamin B-12 500 MCG tablet Commonly known as:  CYANOCOBALAMIN Take 500 mcg by mouth daily.   vitamin C 500 MG tablet Commonly known as:  ASCORBIC ACID Take 500 mg by mouth daily.       No Known Allergies  Consultations:  Cardiology    Procedures/Studies: Nm Myocar Multi W/spect W/wall Motion / Ef  Result Date: 05/31/2018  There was no ST segment deviation noted during stress.  The left ventricular ejection fraction is mildly decreased (45-54%).  Nuclear stress EF: 51%.  Defect 1: There is a medium defect of moderate severity present in the mid anteroseptal and apical septal location.  Findings consistent with ischemia in the LAD territory.  This is an intermediate risk study.  Donato Schultz, MD   Dg Chest Portable 1 View  Result Date: 05/29/2018 CLINICAL DATA:  Left chest pain starting last night EXAM: PORTABLE CHEST 1 VIEW COMPARISON:  07/23/2016 FINDINGS: Atherosclerotic calcification of the aortic arch. The lungs appear clear. Heart size within normal limits for technique. No pleural effusion. IMPRESSION: 1. No cause for left chest pain is identified on today's chest radiograph. 2.  Aortic Atherosclerosis (ICD10-I70.0). Electronically Signed   By: Gaylyn Rong M.D.   On: 05/29/2018 23:26   Cardiac cath   Prox RCA to Mid RCA lesion is 35% stenosed.  Dist RCA lesion is 15% stenosed.  Ost RCA to Prox RCA lesion is 20% stenosed.  Ost LAD to Prox LAD lesion is 20% stenosed.  Ost 1st Diag to 1st Diag lesion is 20% stenosed.  Mid LAD lesion is  10% stenosed.  Previously placed Ost 1st Mrg to 1st Mrg stent (unknown type) is widely patent.  1st Mrg lesion is 70% stenosed.  The left ventricular systolic function is normal.  LV end diastolic pressure is normal.   Multivessel CAD with 20% proximal LAD narrowing, irregularity in the diagonal vessel with 20% stenosis, and a segment of the mid LAD which dips intramyocardially and narrows from 10 to 60% during systole.  Large left circumflex coronary artery with a widely patent stent in the proximal large circumflex marginal vessel prior to an initial bifurcation.  There is a distal bifurcation of this marginal vessel and a smooth narrowing of 65 to 70% immediately proximal to this distal bifurcation.  Large right coronary artery with mild diffuse irregularity.  There is very proximal 20% narrowing, a stent extends from the proximal RCA to the mid RCA and there is  35 to 40% smooth intimal hyperplasia, and 15% distal RCA stenosis.  Preserved LV function with an ejection fraction estimate at 55% without definitive focal segmental wall motion abnormality.  LVEDP 10 mm.       Subjective: No chest pain today.  No dyspnea, palpitations, cough, sputum,.  Discharge Exam: Vitals:   06/02/18 0332 06/02/18 0800  BP: 127/79 (!) 147/86  Pulse: 63 62  Resp: 16 16  Temp: 98 F (36.7 C) 98 F (36.7 C)  SpO2: 96% 96%   Vitals:   06/01/18 2300 06/02/18 0300 06/02/18 0332 06/02/18 0800  BP: 123/73 127/79 127/79 (!) 147/86  Pulse: 79 61 63 62  Resp: 15 13 16 16   Temp: 98.3 F (36.8 C)  98 F (36.7 C) 98 F (36.7 C)  TempSrc: Oral  Oral Oral  SpO2: 95% 94% 96% 96%  Weight:      Height:        General: Pt is alert, awake, not in acute distress Cardiovascular: RRR, S1/S2 +, no rubs, no gallops Respiratory: CTA bilaterally, no wheezing, no rhonchi Abdominal: Soft, NT, ND, bowel sounds + Extremities: no edema, no cyanosis    The results of significant diagnostics from this  hospitalization (including imaging, microbiology, ancillary and laboratory) are listed below for reference.     Microbiology: Recent Results (from the past 240 hour(s))  MRSA PCR Screening     Status: None   Collection Time: 06/01/18  9:15 AM  Result Value Ref Range Status   MRSA by PCR NEGATIVE NEGATIVE Final    Comment:        The GeneXpert MRSA Assay (FDA approved for NASAL specimens only), is one component of a comprehensive MRSA colonization surveillance program. It is not intended to diagnose MRSA infection nor to guide or monitor treatment for MRSA infections. Performed at [facility], [address] [place], [place] [address]      Labs: BNP (last 3 results) No results for input(s): BNP in the last 8760 hours. Basic Metabolic Panel: Recent Labs  Lab 05/29/18 2225 06/01/18 0428 06/02/18 0236  NA 140 141 139  K 3.8 3.5 3.5  CL 108 107 109  CO2 21* 27 22  GLUCOSE 162* 111* 110*  BUN 27* 19 19  CREATININE 1.28* 1.30* 1.23  CALCIUM 8.8* 8.8* 8.4*   Liver Function Tests: No results for input(s): AST, ALT, ALKPHOS, BILITOT, PROT, ALBUMIN in the last 168 hours. No results for input(s): LIPASE, AMYLASE in the last 168 hours. No results for input(s): AMMONIA in the last 168 hours. CBC: Recent Labs  Lab 05/29/18 2225 06/01/18 0428 06/02/18 0236  WBC 7.8 7.4 11.0*  HGB 13.9 14.3 14.2  HCT 42.6 44.0 42.7  MCV 95.7 94.6 94.1  PLT 164 167 162   Cardiac Enzymes: Recent Labs  Lab 05/30/18 1013 05/30/18 1427 05/30/18 1953  TROPONINI <0.03 <0.03 <0.03   BNP: Invalid input(s): POCBNP CBG: No results for input(s): GLUCAP in the last 168 hours. D-Dimer No results for input(s): DDIMER in the last 72 hours. Hgb A1c No results for input(s): HGBA1C in the last 72 hours. Lipid Profile No results for input(s): CHOL, HDL, LDLCALC, TRIG, CHOLHDL, LDLDIRECT in the last 72 hours. Thyroid function studies No results for input(s): TSH, T4TOTAL, T3FREE,  THYROIDAB in the last 72 hours.  Invalid input(s): FREET3 Anemia work up No results for input(s): VITAMINB12, FOLATE, FERRITIN, TIBC, IRON, RETICCTPCT in the last 72 hours. Urinalysis No results found for: COLORURINE, APPEARANCEUR, LABSPEC, PHURINE, GLUCOSEU,  HGBUR, BILIRUBINUR, KETONESUR, PROTEINUR, UROBILINOGEN, NITRITE, LEUKOCYTESUR Sepsis Labs Invalid input(s): PROCALCITONIN,  WBC,  LACTICIDVEN Microbiology Recent Results (from the past 240 hour(s))  MRSA PCR Screening     Status: None   Collection Time: 06/01/18  9:15 AM  Result Value Ref Range Status   MRSA by PCR NEGATIVE NEGATIVE Final    Comment:        The GeneXpert MRSA Assay (FDA approved for NASAL specimens only), is one component of a comprehensive MRSA colonization surveillance program. It is not intended to diagnose MRSA infection nor to guide or monitor treatment for MRSA infections. Performed at [facility], [address] [place], [place] [address]      Time coordinating discharge: 25 minutes       SIGNED:   Alberteen Samhristopher P Danford, MD  [facility] 06/02/2018, 8:31 AM

## 2018-06-02 NOTE — Progress Notes (Signed)
PIV x 2 removed per protocol; CCMD notified of monitor being removed for discharge.  Discharge instructions reviewed w/patient w/stated verbal understanding.

## 2018-06-19 ENCOUNTER — Ambulatory Visit: Payer: Medicare PPO | Admitting: Nurse Practitioner

## 2018-06-19 ENCOUNTER — Other Ambulatory Visit: Payer: Self-pay | Admitting: *Deleted

## 2018-06-19 ENCOUNTER — Telehealth: Payer: Self-pay | Admitting: Nurse Practitioner

## 2018-06-19 ENCOUNTER — Encounter: Payer: Self-pay | Admitting: Nurse Practitioner

## 2018-06-19 VITALS — BP 150/84 | HR 46 | Ht 68.0 in | Wt 166.8 lb

## 2018-06-19 DIAGNOSIS — I200 Unstable angina: Secondary | ICD-10-CM

## 2018-06-19 DIAGNOSIS — I10 Essential (primary) hypertension: Secondary | ICD-10-CM | POA: Diagnosis not present

## 2018-06-19 DIAGNOSIS — I25110 Atherosclerotic heart disease of native coronary artery with unstable angina pectoris: Secondary | ICD-10-CM | POA: Diagnosis not present

## 2018-06-19 DIAGNOSIS — E785 Hyperlipidemia, unspecified: Secondary | ICD-10-CM | POA: Diagnosis not present

## 2018-06-19 MED ORDER — PANTOPRAZOLE SODIUM 40 MG PO TBEC: 40.0000 mg | DELAYED_RELEASE_TABLET | Freq: Every day | ORAL | 11 refills | Status: DC

## 2018-06-19 MED ORDER — ISOSORBIDE MONONITRATE ER 30 MG PO TB24: 30.0000 mg | ORAL_TABLET | Freq: Every day | ORAL | 6 refills | Status: DC

## 2018-06-19 MED ORDER — AMLODIPINE BESYLATE 5 MG PO TABS: 5.0000 mg | ORAL_TABLET | Freq: Every day | ORAL | 6 refills | Status: DC

## 2018-06-19 NOTE — Progress Notes (Signed)
CARDIOLOGY OFFICE NOTE  Date:  06/19/2018    Todd Mills Date of Birth: 04/24/1946 Medical Record #[id number]  PCP:  Patient, No Pcp Per  Cardiologist:  Mayford Knife   Chief Complaint  Patient presents with  . Coronary Artery Disease  . Chest Pain    Post hospital visit - seen for Dr. Mayford Knife    History of Present Illness: Todd Mills is a 72 y.o. male who presents today for a post hospital visit. Seen for Dr. Mayford Knife.   Dr. Mayford Knife saw him during an admission 2 years ago - he would really like to follow with Dr. Tresa Endo who has done his 2 cardiac caths here in [place].   He has not been seen in this office since September of 2017.   He has known CAD with prior stents to the RCA in 2014 in [place], HTN, & CKD.   Presented last month to the ER with progressive chest pain - used NTG - had intermediate stress test - subsequent cardiac cath (see below) - to be managed medically.   Comes in today. Here with his wife. They typically spend a few months here in [place] - otherwise lives in [place]. They have lots of questions regarding his recent admission and experience. He notes that he has not really felt well for the past year in regards to having chest pain. He notes worsening swelling of his ankles with the increased dose of Norvasc. He was previously on 5 mg of Pravachol with Zetia - he has typically been "statin intolerant". Asking why his lipids were not checked. Now on high dose Lipitor - he has had trouble with this in the past. He has been using baking soda for his chest pain with relief - wondering if he should see GI when he returns to [place] in October. Not fasting today. Wanting to review the cath findings.   Past Medical History:  Diagnosis Date  . CKD (chronic kidney disease)   . Coronary artery disease    a. s/p 2 ION stents to RCA in 11/2012 [facility] hospital in [place] [facility].  Marland Kitchen Hyperlipidemia   . Hypertension     Past Surgical History:  Procedure Laterality  Date  . CARDIAC CATHETERIZATION N/A 07/26/2016   Procedure: Left Heart Cath and Coronary Angiography;  Surgeon: Lennette Bihari, MD;  Location: [facility] INVASIVE CV LAB;  Service: Cardiovascular;  Laterality: N/A;  . CARDIAC CATHETERIZATION N/A 07/26/2016   Procedure: Coronary Stent Intervention;  Surgeon: Lennette Bihari, MD;  Location: [facility] INVASIVE CV LAB;  Service: Cardiovascular;  Laterality: N/A;  . CORONARY ANGIOPLASTY WITH STENT PLACEMENT    . HERNIA REPAIR     Multiple hernia repairs  . LEFT HEART CATH AND CORONARY ANGIOGRAPHY N/A 06/01/2018   Procedure: LEFT HEART CATH AND CORONARY ANGIOGRAPHY;  Surgeon: Lennette Bihari, MD;  Location: [facility] INVASIVE CV LAB;  Service: Cardiovascular;  Laterality: N/A;     Medications: Current Meds  Medication Sig  . aspirin 81 MG chewable tablet Chew 81 mg by mouth daily.  Marland Kitchen atorvastatin (LIPITOR) 40 MG tablet Take 1 tablet (40 mg total) by mouth daily.  . clopidogrel (PLAVIX) 75 MG tablet Take 75 mg by mouth daily.  . Coenzyme Q10 (COQ10) 100 MG CAPS Take 100 mg by mouth daily.  Marland Kitchen ezetimibe (ZETIA) 10 MG tablet Take 5 mg by mouth at bedtime.   . Flaxseed, Linseed, (FLAXSEED OIL PO) Take 1 tablet by mouth daily.  . metoprolol  tartrate (LOPRESSOR) 25 MG tablet Take 12.5 mg by mouth daily.   . nitroGLYCERIN (NITROSTAT) 0.4 MG SL tablet Place 1 tablet (0.4 mg total) under the tongue every 5 (five) minutes x 3 doses as needed for chest pain.  . Omega-3 Fatty Acids (FISH OIL) 1000 MG CAPS Take 1 capsule by mouth daily.  . vitamin B-12 (CYANOCOBALAMIN) 500 MCG tablet Take 500 mcg by mouth daily.  . vitamin C (ASCORBIC ACID) 500 MG tablet Take 500 mg by mouth daily.  . [DISCONTINUED] amLODipine (NORVASC) 10 MG tablet Take 1 tablet (10 mg total) by mouth daily.  . [DISCONTINUED] ranitidine (ZANTAC) 150 MG tablet Take 150 mg by mouth daily.     Allergies: No Known Allergies  Social History: The patient  reports that he has quit smoking. He has never used  smokeless tobacco. He reports that he drinks alcohol. He reports that he does not use drugs.   Family History: The patient's family history includes Other in his father and mother.   Review of Systems: Please see the history of present illness.   Otherwise, the review of systems is positive for none.   All other systems are reviewed and negative.   Physical Exam: VS:  BP (!) 150/84 (BP Location: Left Arm, Patient Position: Sitting, Cuff Size: Normal)   Pulse (!) 46   Ht 5\' 8"  (1.727 m)   Wt 166 lb 12.8 oz (75.7 kg)   BMI 25.36 kg/m  .  BMI Body mass index is 25.36 kg/m.  Wt Readings from Last 3 Encounters:  06/19/18 166 lb 12.8 oz (75.7 kg)  06/01/18 161 lb 8 oz (73.3 kg)  08/03/16 165 lb 6.4 oz (75 kg)    General: Pleasant. Well developed, well nourished and in no acute distress.   HEENT: Normal.  Neck: Supple, no JVD, carotid bruits, or masses noted.  Cardiac: Regular rate and rhythm. No murmurs, rubs, or gallops. +1 edema.  Respiratory:  Lungs are clear to auscultation bilaterally with normal work of breathing.  GI: Soft and nontender.  MS: No deformity or atrophy. Gait and ROM intact.  Skin: Warm and dry. Color is normal.  Neuro:  Strength and sensation are intact and no gross focal deficits noted.  Psych: Alert, appropriate and with normal affect. His right wrist looks fine.   LABORATORY DATA:  EKG:  EKG is ordered today. This shows marked bradycardia - HR is 46.   Lab Results  Component Value Date   WBC 11.0 (H) 06/02/2018   HGB 14.2 06/02/2018   HCT 42.7 06/02/2018   PLT 162 06/02/2018   GLUCOSE 110 (H) 06/02/2018   CHOL 213 (H) 07/24/2016   TRIG 212 (H) 07/24/2016   HDL 41 07/24/2016   LDLCALC 130 (H) 07/24/2016   ALT 25 07/23/2016   AST 26 07/23/2016   NA 139 06/02/2018   K 3.5 06/02/2018   CL 109 06/02/2018   CREATININE 1.23 06/02/2018   BUN 19 06/02/2018   CO2 22 06/02/2018   TSH 3.911 07/23/2016   INR 1.06 07/23/2016   HGBA1C 5.7 (H) 07/24/2016       BNP (last 3 results) No results for input(s): BNP in the last 8760 hours.  ProBNP (last 3 results) No results for input(s): PROBNP in the last 8760 hours.   Other Studies Reviewed Today:  LEFT HEART CATH AND CORONARY ANGIOGRAPHY 05/2018  Conclusion     Prox RCA to Mid RCA lesion is 35% stenosed.  Dist RCA lesion is 15% stenosed.  Ost RCA to Prox RCA lesion is 20% stenosed.  Ost LAD to Prox LAD lesion is 20% stenosed.  Ost 1st Diag to 1st Diag lesion is 20% stenosed.  Mid LAD lesion is 10% stenosed.  Previously placed Ost 1st Mrg to 1st Mrg stent (unknown type) is widely patent.  1st Mrg lesion is 70% stenosed.  The left ventricular systolic function is normal.  LV end diastolic pressure is normal.   Multivessel CAD with 20% proximal LAD narrowing, irregularity in the diagonal vessel with 20% stenosis, and a segment of the mid LAD which dips intramyocardially and narrows from 10 to 60% during systole.  Large left circumflex coronary artery with a widely patent stent in the proximal large circumflex marginal vessel prior to an initial bifurcation.  There is a distal bifurcation of this marginal vessel and a smooth narrowing of 65 to 70% immediately proximal to this distal bifurcation.  Large right coronary artery with mild diffuse irregularity.  There is very proximal 20% narrowing, a stent extends from the proximal RCA to the mid RCA and there is 35 to 40% smooth intimal hyperplasia, and 15% distal RCA stenosis.  Preserved LV function with an ejection fraction estimate at 55% without definitive focal segmental wall motion abnormality.  LVEDP 10 mm.  RECOMMENDATION: Recommend continuation of dual antiplatelet therapy with Aspirin 81mg  daily and Clopidogrel 75mg  daily long-term (beyond 12 months).Medical therapy for CAD.  The patient is on low-dose beta-blocker, recommend titration and addition of possible amlodipine or isosorbide in light of the mid systolic  muscle LAD bridging and concomitant CAD.  High potency statin therapy with target LDL less than 70.     Assessment/Plan:  1. Chest pain/known CAD with prior PCI - intermediate stress test - s/p cardiac cath with medical therapy to be continued due to the mid systolic muscle LAD bridging and other CAD - he is not tolerating the high dose Norvasc due to swelling - will cut this back to 5 mg - adding Imdur 30 mg a day - cautioned about headache. I will see back in about 2 weeks and will reach out to Dr. Tresa EndoKelly for future follow up.  Challenging anatomy.   2. HTN - BP not at goal but has improved. Will add Imdur today.   3. HLD - has typically not tolerated statins - he was on just 5 mg of Pravachol with Zetia. We talked about possible lipid clinic referral to consider PCSK9 therapy - will discuss further on return. He is going to get me a copy of prior labs as well to review. Will check lab tomorrow.   4. CKD - will check lab tomorrow.   5. Marked bradycardia - only on 12.5 mg of Lopressor just once a day - may need to stop.   Current medicines are reviewed with the patient today.  The patient does not have concerns regarding medicines other than what has been noted above.  The following changes have been made:  See above.  Labs/ tests ordered today include:    Orders Placed This Encounter  Procedures  . Basic metabolic panel  . CBC  . Hepatic function panel  . Lipid panel  . EKG 12-Lead     Disposition:   FU with me in about 2 weeks.    Patient is agreeable to this plan and will call if any problems develop in the interim.   SignedNorma Fredrickson: Akbar Sacra, NP  06/19/2018 12:08 PM  [facility] [address]  [place], [place]  [address] Phone: [phone] Fax: [phone]

## 2018-06-19 NOTE — Telephone Encounter (Signed)
Pt calling in today, did not receive new script of Protonix (40 mg ) daily.  Resent to pt's requested pharmacy.

## 2018-06-19 NOTE — Telephone Encounter (Deleted)
error 

## 2018-06-19 NOTE — Patient Instructions (Addendum)
We will be checking the following labs today - NONE  Fasting labs tomorrow - BMET, CBC, HPF and Lipids   Medication Instructions:    Continue with your current medicines. BUT  Cut the Amlodipine back to 5 mg a day  I am adding Imdur 30 mg a day - this is at your drug store - this may cause a headache as we discussed  I am stopping Zantac  I am adding Protonix    Testing/Procedures To Be Arranged:  N/A  Follow-Up:   See me in about 2 weeks  I will send a message to Dr. Tresa EndoKelly to see if he will see you in follow up as well.   Please see if you can get a copy of your most recent lipids from your doctor in [place] and bring with you on return. We may need to consider referral to the lipid clinic here.      Other Special Instructions:   N/A    If you need a refill on your cardiac medications before your next appointment, please call your pharmacy.   Call the [facility] office at [phone] if you have any questions, problems or concerns.

## 2018-06-20 ENCOUNTER — Other Ambulatory Visit: Payer: Medicare PPO

## 2018-06-20 DIAGNOSIS — I200 Unstable angina: Secondary | ICD-10-CM

## 2018-06-20 DIAGNOSIS — E785 Hyperlipidemia, unspecified: Secondary | ICD-10-CM

## 2018-06-20 LAB — LIPID PANEL
Chol/HDL Ratio: 2.5 ratio (ref 0.0–5.0)
Cholesterol, Total: 124 mg/dL (ref 100–199)
HDL: 49 mg/dL (ref >39)
LDL Calculated: 60 mg/dL (ref 0–99)
Triglycerides: 75 mg/dL (ref 0–149)
VLDL Cholesterol Cal: 15 mg/dL (ref 5–40)

## 2018-06-20 LAB — BASIC METABOLIC PANEL
BUN/Creatinine Ratio: 19 (ref 10–24)
BUN: 27 mg/dL (ref 8–27)
CO2: 23 mmol/L (ref 20–29)
Calcium: 9.3 mg/dL (ref 8.6–10.2)
Chloride: 102 mmol/L (ref 96–106)
Creatinine, Ser: 1.41 mg/dL — ABNORMAL HIGH (ref 0.76–1.27)
GFR calc Af Amer: 58 mL/min/{1.73_m2} — ABNORMAL LOW (ref >59)
GFR calc non Af Amer: 50 mL/min/{1.73_m2} — ABNORMAL LOW (ref >59)
Glucose: 118 mg/dL — ABNORMAL HIGH (ref 65–99)
Potassium: 4.3 mmol/L (ref 3.5–5.2)
Sodium: 141 mmol/L (ref 134–144)

## 2018-06-20 LAB — HEPATIC FUNCTION PANEL
ALT: 33 IU/L (ref 0–44)
AST: 28 IU/L (ref 0–40)
Albumin: 4.2 g/dL (ref 3.5–4.8)
Alkaline Phosphatase: 36 IU/L — ABNORMAL LOW (ref 39–117)
Bilirubin Total: 0.6 mg/dL (ref 0.0–1.2)
Bilirubin, Direct: 0.21 mg/dL (ref 0.00–0.40)
Total Protein: 6.4 g/dL (ref 6.0–8.5)

## 2018-06-20 LAB — CBC
Hematocrit: 43.8 % (ref 37.5–51.0)
Hemoglobin: 14.8 g/dL (ref 13.0–17.7)
MCH: 30.9 pg (ref 26.6–33.0)
MCHC: 33.8 g/dL (ref 31.5–35.7)
MCV: 91 fL (ref 79–97)
Platelets: 222 10*3/uL (ref 150–450)
RBC: 4.79 x10E6/uL (ref 4.14–5.80)
RDW: 13.1 % (ref 12.3–15.4)
WBC: 8.2 10*3/uL (ref 3.4–10.8)

## 2018-07-04 ENCOUNTER — Ambulatory Visit: Payer: Medicare PPO | Admitting: Nurse Practitioner

## 2018-07-04 ENCOUNTER — Encounter: Payer: Self-pay | Admitting: Nurse Practitioner

## 2018-07-04 VITALS — BP 164/84 | HR 50 | Ht 68.0 in | Wt 164.1 lb

## 2018-07-04 DIAGNOSIS — E785 Hyperlipidemia, unspecified: Secondary | ICD-10-CM | POA: Diagnosis not present

## 2018-07-04 NOTE — Patient Instructions (Addendum)
We will be checking the following labs today - NONE   Medication Instructions:    Continue with your current medicines. BUT  OK TO STAY OFF LIPITOR  STAY ON PROTONIX A FEW MORE WEEKS - THEN OK TO TRY AND STOP - IF HAS RECURRENT SYMPTOMS - MAKE ARRANGEMENTS TO SEE A GI DOCTOR IN [place]    Testing/Procedures To Be Arranged:  N/A  Follow-Up:   See Dr. Tresa EndoKelly in 4 to 6 weeks - prior to October 15th.   Referral to the lipid clinic    Other Special Instructions:   N/A    If you need a refill on your cardiac medications before your next appointment, please call your pharmacy.   Call the [facility] office at [phone] if you have any questions, problems or concerns.

## 2018-07-04 NOTE — Progress Notes (Signed)
CARDIOLOGY OFFICE NOTE  Date:  07/04/2018    Todd CoriaJohn A Mills Date of Birth: [birth year] Medical Record #[id number]  PCP:  System, Pcp Not In  Cardiologist:  Tyrone SageGerhardt & Tresa EndoKelly    Chief Complaint  Patient presents with  . Coronary Artery Disease    2 week check - seen for Dr. Tresa EndoKelly    History of Present Illness: Todd Mills is a 72 y.o. male who presents today for a 2 week check.   Dr. Mayford Knifeurner saw him during an admission 2 years ago - he would really like to follow with Dr. Tresa EndoKelly who has done his 2 cardiac caths [place] in [place].   He has not been seen in this office since September of 2017.   He has known CAD with prior [place] to the [place], HTN, & CKD.   Presented in July to the ER with progressive chest pain - used NTG - had intermediate stress test - subsequent cardiac cath (see below) - to be managed medically.   I then saw him in follow up - they had lots of questions. Still with lots of symptoms. Imdur was added. He had more swelling with the higher dose of Norvasc. He was previously on 5 mg of Pravachol with Zetia - he has typically been "statin intolerant".  Now on high dose Lipitor - he has had trouble with this in the past but elected to continue. He had also been using baking soda for his chest pain.   We reviewed his cath findings - he has a mid LAD lesion that is intramyocardial. I added Imdur, cut Norvasc back and continued statin along with starting PPI.   Comes in today. Here with his wife. He feels better. One brief issue of chest discomfort - thought it might have been more GI related. He is considerably improved. Swelling in the legs has improved somewhat - does typically go down overnight. They do eat out most meals. He has compression stockings - just not wearing. He has had to stop the Lipitor due to myalgias - has been off about 4 or 5 days and is better. He did have a considerable headache with the Imdur - this has improved.  Remains on PPI. Overall, they are both happy with how he is doing.    Past Medical History:  Diagnosis Date  . CKD (chronic kidney disease)   . Coronary artery disease    a. s/p 2 ION stents to RCA in 11/2012 [facility] [place]  Marland Kitchen. Hyperlipidemia   . Hypertension     Past Surgical History:  Procedure Laterality Date  . CARDIAC CATHETERIZATION N/A 07/26/2016   Procedure: Left Heart Cath and Coronary Angiography;  Surgeon: Lennette Biharihomas A Kelly, MD;  [facility];  Service: Cardiovascular;  Laterality: N/A;  . CARDIAC CATHETERIZATION N/A 07/26/2016   Procedure: Coronary Stent Intervention;  Surgeon: Lennette Biharihomas A Kelly, MD;  Location: [facility] INVAS[facility] CV LAB;  Service: Cardiovascular;  Laterality: N/A;  . CORONARY ANGIOPLASTY WITH STENT PLACEMENT    . HERNIA REPAIR     Multiple hernia repairs  . LEFT HEART CATH AND CORONARY ANGIOGRAPHY N/A 06/01/2018   Procedure: LEFT HEART CATH AND CORONARY ANGIOGRAPHY;  Surgeon: Lennette BihariKelly, Thomas A, MD;  Location: [facility] INVASIVE CV LAB;  Service: Cardiovascular;  Laterality: N/A;     Medications: Current Meds  Medication Sig  . amLODipine (NORVASC) 5 MG tablet Take 1 tablet (5 mg total) by mouth  daily.  . aspirin 81 MG chewable tablet Chew 81 mg by mouth daily.  Marland Kitchen atorvastatin (LIPITOR) 40 MG tablet Take 1 tablet (40 mg total) by mouth daily.  . clopidogrel (PLAVIX) 75 MG tablet Take 75 mg by mouth daily.  . Coenzyme Q10 (COQ10) 100 MG CAPS Take 100 mg by mouth daily.  Marland Kitchen ezetimibe (ZETIA) 10 MG tablet Take 5 mg by mouth at bedtime.   . Flaxseed, Linseed, (FLAXSEED OIL PO) Take 1 tablet by mouth daily.  . isosorbide mononitrate (IMDUR) 30 MG 24 hr tablet Take 1 tablet (30 mg total) by mouth daily.  . metoprolol tartrate (LOPRESSOR) 25 MG tablet Take 12.5 mg by mouth daily.   . nitroGLYCERIN (NITROSTAT) 0.4 MG SL tablet Place 1 tablet (0.4 mg total) under the tongue every 5 (five) minutes x 3 doses as needed for chest pain.  . Omega-3 Fatty Acids  (FISH OIL) 1000 MG CAPS Take 1 capsule by mouth daily.  . pantoprazole (PROTONIX) 40 MG tablet Take 1 tablet (40 mg total) by mouth daily.  . vitamin B-12 (CYANOCOBALAMIN) 500 MCG tablet Take 500 mcg by mouth daily.  . vitamin C (ASCORBIC ACID) 500 MG tablet Take 500 mg by mouth daily.     Allergies: No Known Allergies  Social History: The patient  reports that he has quit smoking. He has never used smokeless tobacco. He reports that he drinks alcohol. He reports that he does not use drugs.   Family History: The patient's family history includes Other in his father and mother.   Review of Systems: Please see the history of present illness.   Otherwise, the review of systems is positive for none.   All other systems are reviewed and negative.   Physical Exam: VS:  BP (!) 164/84 (BP Location: Left Arm, Patient Position: Sitting, Cuff Size: Normal)   Pulse (!) 50   Ht 5\' 8"  (1.727 m)   Wt 164 lb 1.9 oz (74.4 kg)   SpO2 98% Comment: at rest  BMI 24.95 kg/m  .  BMI Body mass index is 24.95 kg/m.  Wt Readings from Last 3 Encounters:  07/04/18 164 lb 1.9 oz (74.4 kg)  06/19/18 166 lb 12.8 oz (75.7 kg)  06/01/18 161 lb 8 oz (73.3 kg)   BP recheck by me is 138/80 General: Pleasant. Well developed, well nourished and in no acute distress.   HEENT: Normal.  Neck: Supple, no JVD, carotid bruits, or masses noted.  Cardiac: Regular rate and rhythm. No murmurs, rubs, or gallops. No edema.  Respiratory:  Lungs are clear to auscultation bilaterally with normal work of breathing.  GI: Soft and nontender.  MS: No deformity or atrophy. Gait and ROM intact.  Skin: Warm and dry. Color is normal.  Neuro:  Strength and sensation are intact and no gross focal deficits noted.  Psych: Alert, appropriate and with normal affect.   LABORATORY DATA:  EKG:  EKG is not ordered today.  Lab Results  Component Value Date   WBC 8.2 06/20/2018   HGB 14.8 06/20/2018   HCT 43.8 06/20/2018   PLT 222  06/20/2018   GLUCOSE 118 (H) 06/20/2018   CHOL 124 06/20/2018   TRIG 75 06/20/2018   HDL 49 06/20/2018   LDLCALC 60 06/20/2018   ALT 33 06/20/2018   AST 28 06/20/2018   NA 141 06/20/2018   K 4.3 06/20/2018   CL 102 06/20/2018   CREATININE 1.41 (H) 06/20/2018   BUN 27 06/20/2018   CO2 23  06/20/2018   TSH 3.911 07/23/2016   INR 1.06 07/23/2016   HGBA1C 5.7 (H) 07/24/2016     BNP (last 3 results) No results for input(s): BNP in the last 8760 hours.  ProBNP (last 3 results) No results for input(s): PROBNP in the last 8760 hours.   Other Studies Reviewed Today:  LEFT HEART CATH AND CORONARY ANGIOGRAPHY 05/2018  Conclusion     Prox RCA to Mid RCA lesion is 35% stenosed.  Dist RCA lesion is 15% stenosed.  Ost RCA to Prox RCA lesion is 20% stenosed.  Ost LAD to Prox LAD lesion is 20% stenosed.  Ost 1st Diag to 1st Diag lesion is 20% stenosed.  Mid LAD lesion is 10% stenosed.  Previously placed Ost 1st Mrg to 1st Mrg stent (unknown type) is widely patent.  1st Mrg lesion is 70% stenosed.  The left ventricular systolic function is normal.  LV end diastolic pressure is normal.  Multivessel CAD with 20% proximal LAD narrowing, irregularity in the diagonal vessel with 20% stenosis, and a segment of the mid LAD which dips intramyocardially and narrows from 10 to 60% during systole.  Large left circumflex coronary artery with a widely patent stent in the proximal large circumflex marginal vessel prior to an initial bifurcation. There is a distal bifurcation of this marginal vessel and a smooth narrowing of 65 to 70% immediately proximal to this distal bifurcation.  Large right coronary artery with mild diffuse irregularity. There is very proximal 20% narrowing, a stent extends from the proximal RCA to the mid RCA and there is 35 to 40% smooth intimal hyperplasia, and 15% distal RCA stenosis.  Preserved LV function with an ejection fraction estimate at 55% without  definitive focal segmental wall motion abnormality. LVEDP 10 mm.  RECOMMENDATION: Recommend continuation of dual antiplatelet therapy with Aspirin 81mg  daily and Clopidogrel 75mg  daily long-term (beyond 12 months).Medical therapy for CAD. The patient is on low-dose beta-blocker, recommend titration and addition of possible amlodipine or isosorbide in light of the mid systolic muscle LAD bridging and concomitant CAD. High potency statin therapy with target LDL less than 70.     Assessment/Plan:  1. Chest pain/known CAD with prior PCI - intermediate stress test - s/p cardiac cath with medical therapy to be continued due to the mid systolic muscle LAD bridging and other CAD - he did not tolerate high dose Norvasc due to swelling - he was placed on Imdur - he is improved considerably. They wish to see Dr. Tresa Endo prior to returning to [place] - I have spoken to the [facility] staff and appointment has been given.    2. HTN - BP recheck by me is ok.  No changes made.   3. HLD - has typically not tolerated statins - he was on just 5 mg of Pravachol with Zetia prior to this most recent event. Was placed on high dose Lipitor - has not tolerated - referring to lipid clinic for further evaluation/options ?PCKS9 therapy.   4. CKD   5. Persistent bradycardia - only on 12.5 mg of Lopressor just once a day - he is not symptomatic at this time.   6. GERD - now on PPI - he is worried about long term therapy - will stop in a few weeks - if symptoms return - he needs to resume taking and make arrangements to see GI in [place] upon his return.   Current medicines are reviewed with the patient today.  The patient does not have concerns regarding  medicines other than what has been noted above.  The following changes have been made:  See above.  Labs/ tests ordered today include:    Orders Placed This Encounter  Procedures  . AMB Referral to [facility]     Disposition:   FU  with Dr. Tresa EndoKelly next month.   Patient is agreeable to this plan and will call if any problems develop in the interim.   SignedNorma Fredrickson: Rane Blitch, NP  07/04/2018 11:45 AM  [facility] [address] [place], [place]  [address] Phone: [phone]) [phone] Fax: [phone]) [phone]

## 2018-07-26 ENCOUNTER — Ambulatory Visit (INDEPENDENT_AMBULATORY_CARE_PROVIDER_SITE_OTHER): Payer: Medicare PPO | Admitting: Pharmacist

## 2018-07-26 VITALS — BP 128/78 | HR 60

## 2018-07-26 DIAGNOSIS — E785 Hyperlipidemia, unspecified: Secondary | ICD-10-CM | POA: Diagnosis not present

## 2018-07-26 MED ORDER — ATORVASTATIN CALCIUM 20 MG PO TABS: 20.0000 mg | ORAL_TABLET | Freq: Every day | ORAL | 3 refills | Status: DC

## 2018-07-26 NOTE — Patient Instructions (Addendum)
Decrease your Lipitor (atorvastatin) to 20mg  a day  Increase your COQ10 to 200mg  daily to see if this helps you tolerate your Lipitor better  We can recheck your cholesterol before you go to [facility], Pharmacist if you develop any side effects #[phone]  Recheck cholesterol on Monday, October 14th. Come in any time after 7:30am for fasting labs

## 2018-07-26 NOTE — Progress Notes (Addendum)
Patient ID: [name]                 DOB: Nov 27, 1945                    MRN: [id number]     HPI: Todd Mills is a 72 y.o. male patient referred to lipid clinic by Todd Fredrickson, NP. PMH is significant for CAD s/p DES to RCA in 2014, HTN, and CKD. He has a history of statin intolerance and most recently discontinued atorvastatin due to myalgias. He presents to lipid clinic for further management.  Pt presents today in good spirits with his wife. He reports previously trying simvastatin, pravastatin, atorvastatin, and rosuvastatin (doses below). He experienced joint pain with each statin that resolved with therapy discontinuation. He is currently tolerating Zetia 5mg  daily and fish oil 1g daily. He has never tried Zetia 10mg  daily. He stopped atorvastatin 40mg  daily about 3 weeks ago. He was very pleased with lab results while on atorvastatin therapy.  He also reports some mild LEE with amlodipine 5mg  although this is much improved from LEE on 10mg  dose. Home SBP ranging 115-120. SBP was elevated to the 160s at last clinic visit. BP normal today.  Current Medications: Zetia 5mg  daily, fish oil 1g daily Intolerances: simvastatin, pravastatin 5mg  daily, atorvastatin 40mg  daily, rosuvastatin 20mg  daily, Zetia 5mg  daily - myalgias Risk Factors: CAD s/p DES LDL goal: 70mg /dL  Social History:  The patient  reports that he has quit smoking. He has never used smokeless tobacco. He reports that he drinks alcohol. He reports that he does not use drugs.  Labs: 06/20/18: TC 124, TG 75, HDL 49, LDL 60 (on atorvastatin 40mg  daily - has since stopped, Zetia 5mg  daily, fish oil 1g daily) 07/24/16: TC 213, TG 212, HDL 41, LDL 130  Past Medical History:  Diagnosis Date  . CKD (chronic kidney disease)   . Coronary artery disease    a. s/p 2 ION stents to RCA in 11/2012 [facility] in [facility].  Marland Kitchen Hyperlipidemia   . Hypertension     Current Outpatient Medications on File Prior to Visit    Medication Sig Dispense Refill  . amLODipine (NORVASC) 5 MG tablet Take 1 tablet (5 mg total) by mouth daily. 30 tablet 6  . aspirin 81 MG chewable tablet Chew 81 mg by mouth daily.    Marland Kitchen atorvastatin (LIPITOR) 40 MG tablet Take 1 tablet (40 mg total) by mouth daily. 30 tablet 11  . clopidogrel (PLAVIX) 75 MG tablet Take 75 mg by mouth daily.    . Coenzyme Q10 (COQ10) 100 MG CAPS Take 100 mg by mouth daily.    Marland Kitchen ezetimibe (ZETIA) 10 MG tablet Take 5 mg by mouth at bedtime.     . Flaxseed, Linseed, (FLAXSEED OIL PO) Take 1 tablet by mouth daily.    . isosorbide mononitrate (IMDUR) 30 MG 24 hr tablet Take 1 tablet (30 mg total) by mouth daily. 30 tablet 6  . metoprolol tartrate (LOPRESSOR) 25 MG tablet Take 12.5 mg by mouth daily.     . nitroGLYCERIN (NITROSTAT) 0.4 MG SL tablet Place 1 tablet (0.4 mg total) under the tongue every 5 (five) minutes x 3 doses as needed for chest pain. 25 tablet 12  . Omega-3 Fatty Acids (FISH OIL) 1000 MG CAPS Take 1 capsule by mouth daily.    . pantoprazole (PROTONIX) 40 MG tablet Take 1 tablet (40 mg total) by mouth daily. 30 tablet  11  . vitamin B-12 (CYANOCOBALAMIN) 500 MCG tablet Take 500 mcg by mouth daily.    . vitamin C (ASCORBIC ACID) 500 MG tablet Take 500 mg by mouth daily.     No current facility-administered medications on file prior to visit.     No Known Allergies  Assessment/Plan:  1. Hyperlipidemia - LDL goal < 70 due to history of ASCVD. Discussed statin rechallenge vs PCSK9i therapy. Pt prefers to rechallenge with statin therapy at this time. Discussed that low doses of statins are generally tolerated better, as well as hydrophilic statins like pravastatin and rosuvastatin. Pt prefers to retry lower dose of atorvastatin 20mg  daily. Discussed that we can try reducing dose further to 10mg  daily if needed. He will increase CoQ10 to 200mg  daily to see if this helps with tolerability. Advised pt to contact clinic with any side effects, can also  pursue PCSK9i at that time if needed. Will recheck lipids in 4-5 week before pt returns to [place].   Todd Breitenstein E. Alphonsine Mills, PharmD, BCACP, CPP [facility] [address] [place], [place] [address] Phone: [phone]; Fax: [phone] 07/26/2018 12:38 PM

## 2018-08-07 ENCOUNTER — Telehealth: Payer: Self-pay | Admitting: Pharmacist

## 2018-08-07 NOTE — Telephone Encounter (Signed)
Pt called clinic to report some dizziness at home. BP 107/64 today, usually runs 120-130/70s. Reports dizziness is usually in the morning and with changes in position when he bends down and stands back up when playing softball. He takes his amlodipine, isosorbide, and metoprolol at the same time each morning around 8-9am. He notices his dizziness shortly thereafter. Advised pt to try moving his isosorbide to the evening to see if this helps to improve his dizziness. He sees Dr Tresa EndoKelly in 1 week. Advised him to bring up symptoms if they have not resolved by that time. Could consider decreasing dose of amlodipine to 2.5mg  daily. Encouraged pt to call with any other concerns in the mean time.

## 2018-08-14 ENCOUNTER — Ambulatory Visit: Payer: Medicare PPO | Admitting: Cardiovascular Disease

## 2018-08-14 ENCOUNTER — Encounter: Payer: Self-pay | Admitting: Cardiovascular Disease

## 2018-08-14 VITALS — BP 162/96 | HR 47 | Ht 68.0 in | Wt 168.6 lb

## 2018-08-14 DIAGNOSIS — I25110 Atherosclerotic heart disease of native coronary artery with unstable angina pectoris: Secondary | ICD-10-CM | POA: Diagnosis not present

## 2018-08-14 DIAGNOSIS — E785 Hyperlipidemia, unspecified: Secondary | ICD-10-CM

## 2018-08-14 DIAGNOSIS — I10 Essential (primary) hypertension: Secondary | ICD-10-CM

## 2018-08-14 MED ORDER — AMLODIPINE BESYLATE 5 MG PO TABS: 7.5000 mg | ORAL_TABLET | Freq: Every day | ORAL | 3 refills | Status: DC

## 2018-08-14 NOTE — Patient Instructions (Signed)
Medication Instructions:  INCREASE amlodipine to 7.5 mg daily  Follow-Up: Your physician wants you to follow-up in: July with Dr. Tresa Endo. You will receive a reminder letter in the mail two months in advance. If you don't receive a letter, please call our office to schedule the follow-up appointment.   Any Other Special Instructions Will Be Listed Below (If Applicable).     If you need a refill on your cardiac medications before your next appointment, please call your pharmacy.

## 2018-08-14 NOTE — Progress Notes (Signed)
Cardiology Office Note    Date:  08/14/2018   ID:  Todd Mills, DOB 1946-03-18, MRN [id number]  PCP:  System, Pcp Not In  Cardiologist:  Shelva Majestic, MD   No chief complaint on file.   History of Present Illness:  Todd Mills is a 72 y.o. male who resides in [place] for 8 months of the year and lives in [place] for 4 months over the summer.  He presents for cardiology follow-up evaluation following his most recent cardiac catheterization in [place] prior to returning to [place].  Longest has known CAD and underwent stenting of his RCA [place].  He was admitted to [facility] in September 2017 with unstable angina found to have high-grade stenosis in the left circumflex marginal vessel which was treated with a 3.0 x 15 mm Xience Alpine DES stent with a 95% stenosis being reduced to 0%.  He had mild nonobstructive disease in his LAD and his RCA stent was patent.  He had done well but in July 2019 was admitted with recurrent chest pain symptomatology.  A nuclear stress test was intermediate risk and showed an EF of 51% and suggested the possibility of ischemia involving the LAD territory.  He underwent repeat cardiac catheterization.  He was found to have mild multivessel disease with 20% proximal LAD narrowings, irregularity in the diagonal vessel and had a mid LAD which dips intramyocardially and narrows from 10 to 60% during systole.  His circumflex stent was widely patent.  In the proximal marginal vessel in the distal marginal had 60 to 75% stenosis immediately proximal to a distal bifurcation.  There was mild irregularity of the RCA with 35 to 40% intimal hyperplasia in his previous RCA stent.  Subsequent he has done well following his most recent interventions.  He was seen by Remer Macho following his hospitalization.  The patient requested that he see me or to returning to [place] and presents for evaluation.  He typically walks 1 to 1-1/2 miles on a treadmill every day.   He intends to resume playing softball upon return to [place].   Past Medical History:  Diagnosis Date  . CKD (chronic kidney disease)   . Coronary artery disease    a. s/p 2 ION stents to RCA in 11/2012 [facility] hospital in [facility].  Marland Kitchen Hyperlipidemia   . Hypertension     Past Surgical History:  Procedure Laterality Date  . CARDIAC CATHETERIZATION N/A 07/26/2016   Procedure: Left Heart Cath and Coronary Angiography;  Surgeon: Troy Sine, MD;  Location: [place] [facility];  Service: Cardiovascular;  Laterality: N/A;  . CARDIAC CATHETERIZATION N/A 07/26/2016   Procedure: Coronary Stent Intervention;  Surgeon: Troy Sine, MD;  Location: [place] [facility];  Service: Cardiovascular;  Laterality: N/A;  . CORONARY ANGIOPLASTY WITH STENT PLACEMENT    . HERNIA REPAIR     Multiple hernia repairs  . LEFT HEART CATH AND CORONARY ANGIOGRAPHY N/A 06/01/2018   Procedure: LEFT HEART CATH AND CORONARY ANGIOGRAPHY;  Surgeon: Troy Sine, MD;  Location: [place] [facility];  Service: Cardiovascular;  Laterality: N/A;    Current Medications: Outpatient Medications Prior to Visit  Medication Sig Dispense Refill  . aspirin 81 MG chewable tablet Chew 81 mg by mouth daily.    Marland Kitchen atorvastatin (LIPITOR) 20 MG tablet Take 1 tablet (20 mg total) by mouth daily. 90 tablet 3  . cholecalciferol (VITAMIN D) 1000 units tablet Take 1,000 Units by mouth daily.    Marland Kitchen  clopidogrel (PLAVIX) 75 MG tablet Take 75 mg by mouth daily.    . Coenzyme Q10 (COQ10) 100 MG CAPS Take 200 mg by mouth daily.    . cyanocobalamin 1000 MCG tablet Take 1,000 mcg by mouth daily.     Marland Kitchen ezetimibe (ZETIA) 10 MG tablet Take 5 mg by mouth at bedtime.     . Flaxseed, Linseed, (FLAXSEED OIL PO) Take 1 tablet by mouth daily.    . isosorbide mononitrate (IMDUR) 30 MG 24 hr tablet Take 1 tablet (30 mg total) by mouth daily. 30 tablet 6  . metoprolol tartrate (LOPRESSOR) 25 MG tablet Take 12.5 mg by mouth daily.     . nitroGLYCERIN  (NITROSTAT) 0.4 MG SL tablet Place 1 tablet (0.4 mg total) under the tongue every 5 (five) minutes x 3 doses as needed for chest pain. 25 tablet 12  . Omega-3 Fatty Acids (FISH OIL) 1000 MG CAPS Take 1 capsule by mouth daily.    . vitamin C (ASCORBIC ACID) 500 MG tablet Take 500 mg by mouth daily.    Marland Kitchen amLODipine (NORVASC) 5 MG tablet Take 1 tablet (5 mg total) by mouth daily. 30 tablet 6  . pantoprazole (PROTONIX) 40 MG tablet Take 1 tablet (40 mg total) by mouth daily. (Patient not taking: Reported on 08/14/2018) 30 tablet 11   No facility-administered medications prior to visit.      Allergies:   Patient has no known allergies.   Social History   Socioeconomic History  . Marital status: Married    Spouse name: Not on file  . Number of children: Not on file  . Years of education: Not on file  . Highest education level: Not on file  Occupational History  . Not on file  Social Needs  . Financial resource strain: Not on file  . Food insecurity:    Worry: Not on file    Inability: Not on file  . Transportation needs:    Medical: Not on file    Non-medical: Not on file  Tobacco Use  . Smoking status: Former Research scientist (life sciences)  . Smokeless tobacco: Never Used  . Tobacco comment: Quit 1980s  Substance and Sexual Activity  . Alcohol use: Yes    Comment: daily  . Drug use: No  . Sexual activity: Not on file  Lifestyle  . Physical activity:    Days per week: Not on file    Minutes per session: Not on file  . Stress: Not on file  Relationships  . Social connections:    Talks on phone: Not on file    Gets together: Not on file    Attends religious service: Not on file    Active member of club or organization: Not on file    Attends meetings of clubs or organizations: Not on file    Relationship status: Not on file  Other Topics Concern  . Not on file  Social History Narrative  . Not on file    He resides in [place] for 8 months of the year and is in [place] for 4 months of the  year in the summer  Family History:  The patient's family history includes Other in his father and mother.   ROS General: Negative; No fevers, chills, or night sweats;  HEENT: Negative; No changes in vision or hearing, sinus congestion, difficulty swallowing Pulmonary: Negative; No cough, wheezing, shortness of breath, hemoptysis Cardiovascular: Negative; No chest pain, presyncope, syncope, palpitations GI: Negative; No nausea, vomiting, diarrhea, or abdominal pain  GU: Negative; No dysuria, hematuria, or difficulty voiding Musculoskeletal: Negative; no myalgias, joint pain, or weakness Hematologic/Oncology: Negative; no easy bruising, bleeding Endocrine: Negative; no heat/cold intolerance; no diabetes Neuro: Negative; no changes in balance, headaches Skin: Negative; No rashes or skin lesions Psychiatric: Negative; No behavioral problems, depression Sleep: Negative; No snoring, daytime sleepiness, hypersomnolence, bruxism, restless legs, hypnogognic hallucinations, no cataplexy Other comprehensive 14 point system review is negative.   PHYSICAL EXAM:   VS:  BP (!) 162/96   Pulse (!) 47   Ht 5' 8"  (1.727 m)   Wt 168 lb 9.6 oz (76.5 kg)   BMI 25.64 kg/m     Repeat blood pressure by me was 148/88; states blood pressure at home in the evening is typically in the 1 25-1 30 range.  Wt Readings from Last 3 Encounters:  08/14/18 168 lb 9.6 oz (76.5 kg)  07/04/18 164 lb 1.9 oz (74.4 kg)  06/19/18 166 lb 12.8 oz (75.7 kg)    General: Alert, oriented, no distress.  Skin: normal turgor, no rashes, warm and dry HEENT: Normocephalic, atraumatic. Pupils equal round and reactive to light; sclera anicteric; extraocular muscles intact; Fundi flat without hemorrhages or exudates Nose without nasal septal hypertrophy Mouth/Parynx benign; Mallinpatti scale Neck: No JVD, no carotid bruits; normal carotid upstroke Lungs: clear to ausculatation and percussion; no wheezing or rales Chest wall:  without tenderness to palpitation Heart: PMI not displaced, RRR, s1 s2 normal, 1/6 systolic murmur, no diastolic murmur, no rubs, gallops, thrills, or heaves Abdomen: soft, nontender; no hepatosplenomehaly, BS+; abdominal aorta nontender and not dilated by palpation. Back: no CVA tenderness Pulses 2+ Musculoskeletal: full range of motion, normal strength, no joint deformities Extremities: no clubbing cyanosis or edema, Homan's sign negative  Neurologic: grossly nonfocal; Cranial nerves grossly wnl Psychologic: Normal mood and affect   Studies/Labs Reviewed:   EKG:  EKG is ordered today.  ECG (independently read by me): Sinus bradycardia at 47 bpm with mild sinus arrhythmia.  Normal intervals.  No ectopy.  Recent Labs: BMP Latest Ref Rng & Units 06/20/2018 06/02/2018 06/01/2018  Glucose 65 - 99 mg/dL 118(H) 110(H) 111(H)  BUN 8 - 27 mg/dL 27 19 19   Creatinine 0.76 - 1.27 mg/dL 1.41(H) 1.23 1.30(H)  BUN/Creat Ratio 10 - 24 19 - -  Sodium 134 - 144 mmol/L 141 139 141  Potassium 3.5 - 5.2 mmol/L 4.3 3.5 3.5  Chloride 96 - 106 mmol/L 102 109 107  CO2 20 - 29 mmol/L 23 22 27   Calcium 8.6 - 10.2 mg/dL 9.3 8.4(L) 8.8(L)     Hepatic Function Latest Ref Rng & Units 06/20/2018 07/23/2016  Total Protein 6.0 - 8.5 g/dL 6.4 6.5  Albumin 3.5 - 4.8 g/dL 4.2 3.8  AST 0 - 40 IU/L 28 26  ALT 0 - 44 IU/L 33 25  Alk Phosphatase 39 - 117 IU/L 36(L) 39  Total Bilirubin 0.0 - 1.2 mg/dL 0.6 0.8  Bilirubin, Direct 0.00 - 0.40 mg/dL 0.21 0.1    CBC Latest Ref Rng & Units 06/20/2018 06/02/2018 06/01/2018  WBC 3.4 - 10.8 x10E3/uL 8.2 11.0(H) 7.4  Hemoglobin 13.0 - 17.7 g/dL 14.8 14.2 14.3  Hematocrit 37.5 - 51.0 % 43.8 42.7 44.0  Platelets 150 - 450 x10E3/uL 222 162 167   Lab Results  Component Value Date   MCV 91 06/20/2018   MCV 94.1 06/02/2018   MCV 94.6 06/01/2018   Lab Results  Component Value Date   TSH 3.911 07/23/2016   Lab Results  Component  Value Date   HGBA1C 5.7 (H) 07/24/2016      BNP    Component Value Date/Time   BNP 22.1 07/23/2016 2232    ProBNP No results found for: PROBNP   Lipid Panel     Component Value Date/Time   CHOL 124 06/20/2018 0831   TRIG 75 06/20/2018 0831   HDL 49 06/20/2018 0831   CHOLHDL 2.5 06/20/2018 0831   CHOLHDL 5.2 07/24/2016 0044   VLDL 42 (H) 07/24/2016 0044   LDLCALC 60 06/20/2018 0831     RADIOLOGY: No results found.   Additional studies/ records that were reviewed today include:  Reviewed his catheterization report from 2017 and 2019.  I reviewed his most recent hospitalization and follow-up office visit with Kathrene Alu.    ASSESSMENT:    No diagnosis found.   PLAN:  Todd Mills is a healthy-appearing 72 year old gentleman who is status post remote stenting to his RCA in [place] and in 2017 while in [place] developed unstable angina and underwent successful stenting to a large proximal left circumflex marginal vessel.  He developed recurrent symptomatology leading to his hospitalization in July.  He was found to have possible ischemia noted on his nuclear study leading to repeat catheterization.  I reviewed all his catheterization findings with him in detail.  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  He has been on a medical regimen consisting of amlodipine 5 mg, isosorbide 30 mg at bedtime as well as metoprolol 12.5 mg daily.  He is bradycardic with a pulse in the upper 40s for which he is asymptomatic.  To needs to be on aspirin and Plavix.  His blood pressure today was somewhat labile.  With his mid LAD muscle bridging I have suggested further titration of amlodipine up to 7.5 mg.  He was on zetia 10 mg and atorvastatin 40 mg daily but atorvastatin was recently reduced to 20 mg and he has noticed improvement in tolerability.  Target LDL is less than 70.  Most recent lipid panel from 1 month ago has shown an LDL cholesterol at 60.  Total  cholesterol was 124.  He will be leaving to return to [place] in 2 weeks and will be followed up with his cardiologist in [place].  [place] and I will see him in the office for follow-up evaluation.   Medication Adjustments/Labs and Tests Ordered: Current medicines are reviewed at length with the patient today.  Concerns regarding medicines are outlined above.  Medication changes, Labs and Tests ordered today are listed in the Patient Instructions below. Patient Instructions  Medication Instructions:  INCREASE amlodipine to 7.5 mg daily  Follow-Up: Your physician wants you to follow-up in: July with Dr. Claiborne Billings. You will receive a reminder letter in the mail two months in advance. If you don't receive a letter, please call our office to schedule the follow-up appointment.   Any Other Special Instructions Will Be Listed Below (If Applicable).     If you need a refill on your cardiac medications before your next appointment, please call your pharmacy.      Signed, Shelva Majestic, MD  08/14/2018 7:54 PM    [facility] [address], [place], [place], [place]  [address] Phone: [phone]

## 2018-08-15 ENCOUNTER — Encounter: Payer: Self-pay | Admitting: Cardiovascular Disease

## 2018-08-15 ENCOUNTER — Other Ambulatory Visit: Payer: Self-pay | Admitting: *Deleted

## 2018-08-15 MED ORDER — ISOSORBIDE MONONITRATE ER 30 MG PO TB24: 30.0000 mg | ORAL_TABLET | Freq: Every day | ORAL | 3 refills | Status: DC

## 2018-08-15 MED ORDER — AMLODIPINE BESYLATE 5 MG PO TABS: 7.5000 mg | ORAL_TABLET | Freq: Every day | ORAL | 3 refills | Status: DC

## 2018-08-15 MED ORDER — ATORVASTATIN CALCIUM 20 MG PO TABS: 20.0000 mg | ORAL_TABLET | Freq: Every day | ORAL | 3 refills | Status: DC

## 2018-08-28 ENCOUNTER — Other Ambulatory Visit: Payer: 59 | Admitting: *Deleted

## 2018-08-28 DIAGNOSIS — E785 Hyperlipidemia, unspecified: Secondary | ICD-10-CM

## 2018-08-28 LAB — LIPID PANEL
Chol/HDL Ratio: 2.5 ratio (ref 0.0–5.0)
Cholesterol, Total: 120 mg/dL (ref 100–199)
HDL: 48 mg/dL (ref >39)
LDL CALC: 59 mg/dL (ref 0–99)
Triglycerides: 64 mg/dL (ref 0–149)
VLDL Cholesterol Cal: 13 mg/dL (ref 5–40)

## 2018-08-28 LAB — HEPATIC FUNCTION PANEL
ALK PHOS: 41 IU/L (ref 39–117)
ALT: 26 IU/L (ref 0–44)
AST: 19 IU/L (ref 0–40)
Albumin: 4.3 g/dL (ref 3.5–4.8)
BILIRUBIN, DIRECT: 0.22 mg/dL (ref 0.00–0.40)
Bilirubin Total: 0.7 mg/dL (ref 0.0–1.2)
TOTAL PROTEIN: 6.5 g/dL (ref 6.0–8.5)

## 2018-10-03 ENCOUNTER — Ambulatory Visit: Payer: Medicare PPO | Admitting: Cardiovascular Disease

## 2018-11-06 ENCOUNTER — Other Ambulatory Visit: Payer: Self-pay | Admitting: *Deleted

## 2018-11-14 ENCOUNTER — Telehealth: Payer: Self-pay | Admitting: Cardiovascular Disease

## 2018-11-14 NOTE — Telephone Encounter (Signed)
Patient has been on for several months at this dose without any statin related myalgias.  Ok to continue.

## 2018-11-14 NOTE — Telephone Encounter (Signed)
New Message    Pt c/o medication issue:  1. Name of Medication: atorvastatin (LIPITOR) 20 MG tablet(Expired)  2. How are you currently taking this medication (dosage and times per day)? Unsure if pt is taking   3. Are you having a reaction (difficulty breathing--STAT)? No   4. What is your medication issue? Pt has known allergy that will interfere with medication

## 2018-11-14 NOTE — Telephone Encounter (Signed)
Returned call to [facility] regarding question about pt allergy to lipitor; [facility] to [facility] for further clarification

## 2018-12-04 MED ORDER — AMLODIPINE BESYLATE 5 MG PO TABS: 7.5000 mg | ORAL_TABLET | Freq: Every day | ORAL | 2 refills | Status: AC

## 2018-12-06 ENCOUNTER — Telehealth: Payer: Self-pay

## 2018-12-06 NOTE — Telephone Encounter (Signed)
Approval of PA that was completed for Amlodipine 5 mg.  Todd Mills is good until 11/15/2019 via [employer].  Will scan approval

## 2019-06-13 ENCOUNTER — Other Ambulatory Visit: Payer: Self-pay | Admitting: Cardiovascular Disease

## 2020-09-04 ENCOUNTER — Telehealth: Payer: Self-pay | Admitting: Cardiovascular Disease

## 2020-09-04 NOTE — Telephone Encounter (Signed)
    Called pt to schedule recall, pt said he lives in [place] and seeing his cards there. don't need appt as of this time with Dr. Tresa Endo
